# Patient Record
Sex: Male | Born: 1970 | Race: Black or African American | Hispanic: No | Smoking: Never smoker
Health system: Southern US, Community
[De-identification: ages and names within clinical notes are randomized; demographics above are authoritative.]

---

## 2002-11-11 ENCOUNTER — Ambulatory Visit (HOSPITAL_BASED_OUTPATIENT_CLINIC_OR_DEPARTMENT_OTHER): Admission: RE | Admit: 2002-11-11 | Discharge: 2002-11-11 | Payer: Self-pay | Admitting: Urology

## 2003-09-27 ENCOUNTER — Emergency Department (HOSPITAL_COMMUNITY): Admission: EM | Admit: 2003-09-27 | Discharge: 2003-09-28 | Payer: Self-pay | Admitting: Emergency Medicine
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
## 2016-06-20 ENCOUNTER — Telehealth: Payer: Self-pay | Admitting: Pharmacist Clinician (PhC)/ Clinical Pharmacy Specialist

## 2016-06-20 NOTE — Telephone Encounter (Signed)
Well I'm happy that he was prescribing prep but as you mentioned it is critical that HIV be checked for every 3 months. Hopefully have a good opportunity continue promoting prep and PCP world but clarifying the exact protocol that should be followed and some nuances

## 2016-06-20 NOTE — Telephone Encounter (Signed)
Dr. August Saucerean referred this pt here for PreP. Apparently, he has been Truvada over there. Based on his records that were faxed over, he was given 11 refills, which is a big NO, NO for PreP. He wanted to come in at the beginning of the yr and he will call back for appt. Prob has to deal with insurance purposes. He was asking questions about whether more labs need to be done. I, specifically, said yes because it's needed every 3 months.

## 2016-10-09 ENCOUNTER — Ambulatory Visit (INDEPENDENT_AMBULATORY_CARE_PROVIDER_SITE_OTHER): Payer: BC Managed Care – PPO | Admitting: Pharmacist Clinician (PhC)/ Clinical Pharmacy Specialist

## 2016-10-09 ENCOUNTER — Other Ambulatory Visit (HOSPITAL_COMMUNITY)
Admission: RE | Admit: 2016-10-09 | Discharge: 2016-10-09 | Disposition: A | Discharge status: Home / Self Care | Payer: BC Managed Care – PPO | Source: Ambulatory Visit | Attending: Infectious Disease | Admitting: Infectious Disease

## 2016-10-09 DIAGNOSIS — Z7251 High risk heterosexual behavior: Secondary | ICD-10-CM | POA: Diagnosis not present

## 2016-10-09 DIAGNOSIS — I10 Essential (primary) hypertension: Secondary | ICD-10-CM | POA: Diagnosis not present

## 2016-10-09 DIAGNOSIS — Z113 Encounter for screening for infections with a predominantly sexual mode of transmission: Secondary | ICD-10-CM | POA: Insufficient documentation

## 2016-10-09 DIAGNOSIS — B009 Herpesviral infection, unspecified: Secondary | ICD-10-CM | POA: Insufficient documentation

## 2016-10-09 DIAGNOSIS — G43909 Migraine, unspecified, not intractable, without status migrainosus: Secondary | ICD-10-CM | POA: Insufficient documentation

## 2016-10-09 LAB — BASIC METABOLIC PANEL
BUN: 11 mg/dL (ref 7–25)
CHLORIDE: 102 mmol/L (ref 98–110)
CO2: 25 mmol/L (ref 20–31)
CREATININE: 1.01 mg/dL (ref 0.60–1.35)
Calcium: 9.3 mg/dL (ref 8.6–10.3)
GLUCOSE: 80 mg/dL (ref 65–99)
Potassium: 4.5 mmol/L (ref 3.5–5.3)
SODIUM: 138 mmol/L (ref 135–146)

## 2016-10-09 NOTE — Progress Notes (Signed)
Agreed with Joe's note. We will get all of baseline labs today since he was seen by an [facility] practice physician. He requested [place] to fax the office visit to Dr. Clovis Riley. We'll see him back in 3 month. He has BCBS so I'll send his rx to [facility] if the HIV test is neg.  [name], PharmD, BCPS, AAHIVP, CPP Infectious Disease Pharmacist Pager: [phone] 10/09/2016 2:03 PM

## 2016-10-09 NOTE — Patient Instructions (Signed)
Continue taking Truvada We will call you with lab results, and prescribe more Truvada if labs are negative Return to clinic every 3 months

## 2016-10-09 NOTE — Progress Notes (Signed)
HPI: Phillip Calhoun is a 46 y.o. male presenting for PrEP management after recently initiating care with Dr. August Calhoun of [facility].  Allergies: No Known Allergies  Vitals:    Past Medical History: No past medical history on file.  Social History: Social History   Social History  . Marital status: Married    Spouse name: N/A  . Number of children: N/A  . Years of education: N/A   Social History Main Topics  . Smoking status: Not on file  . Smokeless tobacco: Not on file  . Alcohol use Not on file  . Drug use: Unknown  . Sexual activity: Not on file   Other Topics Concern  . Not on file   Social History Narrative  . No narrative on file    Current Regimen: Truvada 1 tablet daily  Labs: No results found for: HIV1RNAQUANT, HIV1RNAVL, CD4TABS, HEPBSAB, HEPBSAG, HCVAB  CrCl: CrCl cannot be calculated (No order found.).  Lipids: No results found for: CHOL, TRIG, HDL, CHOLHDL, VLDL, LDLCALC  Assessment: Phillip Calhoun is already on Truvada, recently prescribed by Dr. August Calhoun of [facility]. He has a past medical history significant for herpes, without any memory of having flares; though he is taking valacyclovir daily.   Regarding PrEP, BR states he has had 4 different partners in the last 3-6 months. He is a versatile partner and does perform oral sex. He does endorse using condoms 100% of the time.  We discussed that Truvada does not protect against STIs, other than HIV and the need for strict compliance to Truvada, as well as continued condom use. With strict compliance, studies have shown a 90% reduction in the risk of HIV contraction.   He currently has 30 tablets left from his original Truvada prescription. Based on the guidelines, we will only be prescribing 3 months at a time. He must return to the clinic for HIV testing and further refills.   We will get baseline labs, including a BMET and all STIs. We will contact Phillip Calhoun with the results and prescribe more Truvada based on  the results. His next Truvada prescription will be sent to [facility] and mailed out after he has completed his current prescription  Recommendations: Continue taking Truvada 1 tablet daily Do not refill until you are out of your current prescription Get baseline labs prior to PrEP initiation RTC July 18 at 9:00 am  Phillip Calhoun, Phillip Calhoun, PharmD Clinical Pharmacy Resident [phone] (Pager) 10/09/2016 10:48 AM

## 2016-10-10 ENCOUNTER — Other Ambulatory Visit: Payer: Self-pay | Admitting: Pharmacist Clinician (PhC)/ Clinical Pharmacy Specialist

## 2016-10-10 LAB — CYTOLOGY, (ORAL, ANAL, URETHRAL) ANCILLARY ONLY
Chlamydia: NEGATIVE
Chlamydia: NEGATIVE
NEISSERIA GONORRHEA: NEGATIVE
Neisseria Gonorrhea: NEGATIVE

## 2016-10-10 LAB — HEPATITIS A ANTIBODY, TOTAL: HEP A TOTAL AB: NONREACTIVE

## 2016-10-10 LAB — URINE CYTOLOGY ANCILLARY ONLY
CHLAMYDIA, DNA PROBE: NEGATIVE
NEISSERIA GONORRHEA: NEGATIVE

## 2016-10-10 LAB — HEPATITIS B SURFACE ANTIGEN: Hepatitis B Surface Ag: NEGATIVE

## 2016-10-10 LAB — HEPATITIS C ANTIBODY: HCV Ab: NEGATIVE

## 2016-10-10 LAB — RPR

## 2016-10-10 LAB — HIV ANTIBODY (ROUTINE TESTING W REFLEX): HIV: NONREACTIVE

## 2016-10-10 LAB — HEPATITIS B SURFACE ANTIBODY,QUALITATIVE: Hep B S Ab: NEGATIVE

## 2016-10-10 MED ORDER — EMTRICITABINE-TENOFOVIR DF 200-300 MG PO TABS: 1.0000 | ORAL_TABLET | Freq: Every day | ORAL | 2 refills | Status: DC

## 2016-10-11 ENCOUNTER — Telehealth: Payer: Self-pay | Admitting: Pharmacist Clinician (PhC)/ Clinical Pharmacy Specialist

## 2016-10-11 NOTE — Telephone Encounter (Signed)
Left a message for Rodger that we have sent for 3 more refills for Truvada for PreP. HIV test and all STDs are neg.

## 2017-01-14 ENCOUNTER — Ambulatory Visit (INDEPENDENT_AMBULATORY_CARE_PROVIDER_SITE_OTHER): Payer: BC Managed Care – PPO | Admitting: Pharmacist Clinician (PhC)/ Clinical Pharmacy Specialist

## 2017-01-14 ENCOUNTER — Other Ambulatory Visit (HOSPITAL_COMMUNITY)
Admission: RE | Admit: 2017-01-14 | Discharge: 2017-01-14 | Disposition: A | Discharge status: Home / Self Care | Payer: BC Managed Care – PPO | Source: Ambulatory Visit | Attending: Infectious Disease | Admitting: Infectious Disease

## 2017-01-14 DIAGNOSIS — Z7251 High risk heterosexual behavior: Secondary | ICD-10-CM | POA: Diagnosis not present

## 2017-01-14 LAB — HIV ANTIBODY (ROUTINE TESTING W REFLEX): HIV 1&2 Ab, 4th Generation: NONREACTIVE

## 2017-01-14 NOTE — Patient Instructions (Signed)
We will get labs today and call in a new prescription once your lab results as negative. [facility].

## 2017-01-14 NOTE — Progress Notes (Signed)
HPI: Phillip Calhoun is a 46 y.o. male who presents today for PrEP follow up.  Allergies: No Known Allergies  Vitals:    Past Medical History: No past medical history on file.  Social History: Social History   Social History  . Marital status: Married    Spouse name: N/A  . Number of children: N/A  . Years of education: N/A   Social History Main Topics  . Smoking status: Not on file  . Smokeless tobacco: Not on file  . Alcohol use Not on file  . Drug use: Unknown  . Sexual activity: Not on file   Other Topics Concern  . Not on file   Social History Narrative  . No narrative on file    Labs: Hep B S Ab (no units)  Date Value  10/09/2016 NEG   Hepatitis B Surface Ag (no units)  Date Value  10/09/2016 NEGATIVE   HCV Ab (no units)  Date Value  10/09/2016 NEGATIVE    CrCl: CrCl cannot be calculated (Patient's most recent lab result is older than the maximum 21 days allowed.).  Assessment: Phillip Calhoun is a 46 year old male who presents today for PrEP follow up. He has been taking Truvada daily since his last visit. He denies any missed doses and states he uses condoms 100% of the time. He states he has not had any symptoms of acute HIV infection.  He has had one new partner in the last 3 months. He is interesting in continuing the relationship with this person and they are HIV positive. I encouraged him to continue to use condoms and told him the risk of transmission is even lower if his partner is virally suppressed.  Phillip Calhoun 3 month prescription was called into [facility] at his last visit. His insurance did not cover Truvada being filled there and he continued to use his previous Rx [facility]. He has requested his [facility] from now on.   Phillip Calhoun has about 3 tablets left and is planning to head out of town later this week. I told the patient we will call him with his lab results and if his HIV antibody  is negative we will [facility] in [facility]. Phillip Calhoun will follow up with the pharmacy to have them [place].  He is a very responsible patient and wants to ensure he does not miss any doses. He asked for his next appointment be scheduled 2 weeks prior to him running out of medication.  Recommendations: - Check HIV antibody, RPR, GC/chlamydia (oral,rectal, urine) - Truvada daily x 3 months, so long as HIV antibody is negative - Follow up pharmacy appointment scheduled 9/19 at 9 AM  Casilda Carlsaylor Simmie Camerer, PharmD, BCPS PGY-2 Infectious Diseases Pharmacy Resident Pager: [phone] 01/14/2017, 8:54 AM

## 2017-01-15 LAB — RPR

## 2017-01-16 ENCOUNTER — Other Ambulatory Visit: Payer: Self-pay | Admitting: Pharmacist Clinician (PhC)/ Clinical Pharmacy Specialist

## 2017-01-16 LAB — URINE CYTOLOGY ANCILLARY ONLY
CHLAMYDIA, DNA PROBE: NEGATIVE
NEISSERIA GONORRHEA: NEGATIVE

## 2017-01-16 LAB — CYTOLOGY, (ORAL, ANAL, URETHRAL) ANCILLARY ONLY
CHLAMYDIA, DNA PROBE: NEGATIVE
Chlamydia: NEGATIVE
NEISSERIA GONORRHEA: NEGATIVE
Neisseria Gonorrhea: NEGATIVE

## 2017-01-16 MED ORDER — EMTRICITABINE-TENOFOVIR DF 200-300 MG PO TABS: 1.0000 | ORAL_TABLET | Freq: Every day | ORAL | 2 refills | Status: DC

## 2017-01-16 NOTE — Progress Notes (Signed)
HIV neg for PrEP. [facility]. Called Thereasa DistanceRodney to let him know.

## 2017-01-22 ENCOUNTER — Ambulatory Visit: Payer: BC Managed Care – PPO

## 2017-03-26 ENCOUNTER — Other Ambulatory Visit (HOSPITAL_COMMUNITY)
Admission: RE | Admit: 2017-03-26 | Discharge: 2017-03-26 | Disposition: A | Discharge status: Home / Self Care | Payer: BC Managed Care – PPO | Source: Ambulatory Visit | Attending: Infectious Disease | Admitting: Infectious Disease

## 2017-03-26 ENCOUNTER — Ambulatory Visit (INDEPENDENT_AMBULATORY_CARE_PROVIDER_SITE_OTHER): Payer: BC Managed Care – PPO | Admitting: Pharmacist Clinician (PhC)/ Clinical Pharmacy Specialist

## 2017-03-26 DIAGNOSIS — Z23 Encounter for immunization: Secondary | ICD-10-CM | POA: Diagnosis not present

## 2017-03-26 DIAGNOSIS — Z7251 High risk heterosexual behavior: Secondary | ICD-10-CM | POA: Insufficient documentation

## 2017-03-26 NOTE — Progress Notes (Addendum)
HPI: Phillip Calhoun is a 46 y.o. male who is here for her routine 3 mo PrEP visit.   Allergies: No Known Allergies  Vitals:    Past Medical History: No past medical history on file.  Social History: Social History   Social History  . Marital status: Married    Spouse name: N/A  . Number of children: N/A  . Years of education: N/A   Social History Main Topics  . Smoking status: Not on file  . Smokeless tobacco: Not on file  . Alcohol use Not on file  . Drug use: Unknown  . Sexual activity: Not on file   Other Topics Concern  . Not on file   Social History Narrative  . No narrative on file    Current Regimen: TRV  Labs: Hep B S Ab (no units)  Date Value  10/09/2016 NEG   Hepatitis B Surface Ag (no units)  Date Value  10/09/2016 NEGATIVE   HCV Ab (no units)  Date Value  10/09/2016 NEGATIVE    CrCl: CrCl cannot be calculated (Patient's most recent lab result is older than the maximum 21 days allowed.).  Lipids: No results found for: CHOL, TRIG, HDL, CHOLHDL, VLDL, LDLCALC  Assessment: Phillip Calhoun is a very good patient on PrEP. He uses condoms a 100% of the time even when he is a receptive partner. He is a versatile partner. He has had about 3 partners in the past 6 months with men only. He missed one dose of TRV when he left the meds at home. Gave him a key chain pill box so he can keep some in there for this type of instances.  We could have delayed the STDs screening until the next visit but he is the type of person that prefers to do it, therefore, we are going to re-swab him today.   He is hep A/B titer neg so will start series today along with his flu.   Recommendations:  STD swabs HIV ab before more TRV (he would like the 90d supply) Hep A/B #1, flu F/u in 1 month for hep B #2  Phillip Calhoun, PharmD, BCPS, AAHIVP, CPP Clinical Infectious Disease Pharmacist [facility] 03/26/2017, 10:01 AM

## 2017-03-26 NOTE — Patient Instructions (Signed)
Will call you tomorrow with results

## 2017-03-27 ENCOUNTER — Other Ambulatory Visit: Payer: Self-pay | Admitting: Pharmacist Clinician (PhC)/ Clinical Pharmacy Specialist

## 2017-03-27 LAB — BASIC METABOLIC PANEL
BUN/Creatinine Ratio: 27 (calc) — ABNORMAL HIGH (ref 6–22)
BUN: 30 mg/dL — ABNORMAL HIGH (ref 7–25)
CALCIUM: 9.8 mg/dL (ref 8.6–10.3)
CHLORIDE: 96 mmol/L — AB (ref 98–110)
CO2: 22 mmol/L (ref 20–32)
Creat: 1.13 mg/dL (ref 0.60–1.35)
GLUCOSE: 88 mg/dL (ref 65–99)
Potassium: 4 mmol/L (ref 3.5–5.3)
Sodium: 132 mmol/L — ABNORMAL LOW (ref 135–146)

## 2017-03-27 LAB — URINE CYTOLOGY ANCILLARY ONLY
CHLAMYDIA, DNA PROBE: NEGATIVE
Neisseria Gonorrhea: NEGATIVE

## 2017-03-27 LAB — CYTOLOGY, (ORAL, ANAL, URETHRAL) ANCILLARY ONLY
CHLAMYDIA, DNA PROBE: NEGATIVE
CHLAMYDIA, DNA PROBE: NEGATIVE
NEISSERIA GONORRHEA: NEGATIVE
Neisseria Gonorrhea: NEGATIVE

## 2017-03-27 LAB — RPR: RPR Ser Ql: NONREACTIVE

## 2017-03-27 LAB — HIV ANTIBODY (ROUTINE TESTING W REFLEX): HIV 1&2 Ab, 4th Generation: NONREACTIVE

## 2017-03-27 MED ORDER — EMTRICITABINE-TENOFOVIR DF 200-300 MG PO TABS: 1.0000 | ORAL_TABLET | Freq: Every day | ORAL | 0 refills | Status: DC

## 2017-03-27 NOTE — Progress Notes (Signed)
HIV ab came back neg so sent in a 90d supply for TRV.

## 2017-04-23 ENCOUNTER — Ambulatory Visit (INDEPENDENT_AMBULATORY_CARE_PROVIDER_SITE_OTHER): Payer: BC Managed Care – PPO | Admitting: Pharmacist Clinician (PhC)/ Clinical Pharmacy Specialist

## 2017-04-23 ENCOUNTER — Ambulatory Visit: Payer: BC Managed Care – PPO

## 2017-04-23 DIAGNOSIS — Z7252 High risk homosexual behavior: Secondary | ICD-10-CM

## 2017-04-23 DIAGNOSIS — Z23 Encounter for immunization: Secondary | ICD-10-CM

## 2017-04-23 NOTE — Progress Notes (Addendum)
HPI: Phillip Calhoun is a 46 y.o. male presenting to [facility] for his second hepatitis B vaccine.  Allergies: No Known Allergies  Past Medical History: No past medical history on file.  Social History: Social History   Social History  . Marital status: Married    Spouse name: N/A  . Number of children: N/A  . Years of education: N/A   Social History Main Topics  . Smoking status: Not on file  . Smokeless tobacco: Not on file  . Alcohol use Not on file  . Drug use: Unknown  . Sexual activity: Not on file   Other Topics Concern  . Not on file   Social History Narrative  . No narrative on file   Current Regimen: Truvada  Labs: Hep B S Ab (no units)  Date Value  10/09/2016 NEG   Hepatitis B Surface Ag (no units)  Date Value  10/09/2016 NEGATIVE   HCV Ab (no units)  Date Value  10/09/2016 NEGATIVE    CrCl: CrCl cannot be calculated (Patient's most recent lab result is older than Phillip maximum 21 days allowed.).  Lipids: No results found for: CHOL, TRIG, HDL, CHOLHDL, VLDL, LDLCALC  Assessment: Thereasa Calhoun is here today for his second hepB shot. He is on Truvada for PrEP. He does not report any sides effects or issues remembering to take his medication. His next 3 month follow up will be in December.  Recommendations: - 2nd hepB shot today - 3 month PrEP f/u appt on 12/20 @ 0900 - Will complete hepA and hepB series at next f/u appt in March  Erin N. Zigmund Danieleja, PharmD PGY1 Pharmacy Resident Pager: [phone] 04/23/2017, 11:16 AM   Agreed with Erin's note.   Ulyses SouthwardMinh Pham, PharmD, BCPS, AAHIVP, CPP Infectious Disease Pharmacist Pager: [phone] 04/23/2017 2:29 PM

## 2017-06-23 ENCOUNTER — Other Ambulatory Visit: Payer: Self-pay | Admitting: Infectious Disease

## 2017-06-26 ENCOUNTER — Other Ambulatory Visit (HOSPITAL_COMMUNITY)
Admission: RE | Admit: 2017-06-26 | Discharge: 2017-06-26 | Disposition: A | Discharge status: Home / Self Care | Payer: BC Managed Care – PPO | Source: Ambulatory Visit | Attending: Infectious Disease | Admitting: Infectious Disease

## 2017-06-26 ENCOUNTER — Ambulatory Visit (INDEPENDENT_AMBULATORY_CARE_PROVIDER_SITE_OTHER): Payer: BC Managed Care – PPO | Admitting: Pharmacist Clinician (PhC)/ Clinical Pharmacy Specialist

## 2017-06-26 DIAGNOSIS — Z7252 High risk homosexual behavior: Secondary | ICD-10-CM | POA: Insufficient documentation

## 2017-06-26 NOTE — Progress Notes (Signed)
HPI: Phillip Calhoun is a 46 y.o. male who is here for his routine 3 mo PrEP visit.   Allergies: No Known Allergies  Vitals:    Past Medical History: No past medical history on file.  Social History: Social History   Socioeconomic History  . Marital status: Married    Spouse name: Not on file  . Number of children: Not on file  . Years of education: Not on file  . Highest education level: Not on file  Social Needs  . Financial resource strain: Not on file  . Food insecurity - worry: Not on file  . Food insecurity - inability: Not on file  . Transportation needs - medical: Not on file  . Transportation needs - non-medical: Not on file  Occupational History  . Not on file  Tobacco Use  . Smoking status: Not on file  Substance and Sexual Activity  . Alcohol use: Not on file  . Drug use: Not on file  . Sexual activity: Not on file  Other Topics Concern  . Not on file  Social History Narrative  . Not on file    Current Regimen: Truvada  Labs: Hep B S Ab (no units)  Date Value  10/09/2016 NEG   Hepatitis B Surface Ag (no units)  Date Value  10/09/2016 NEGATIVE   HCV Ab (no units)  Date Value  10/09/2016 NEGATIVE    CrCl: CrCl cannot be calculated (Patient's most recent lab result is older than the maximum 21 days allowed.).  Lipids: No results found for: CHOL, TRIG, HDL, CHOLHDL, VLDL, LDLCALC  Assessment: Phillip DistanceRodney is here for his 3 mo PrEP visit. He recently became a monogamous with a HIV positive partner in [place]. Apparently, he is in the health care field. He has been very good with his condom use. Encourage him to continue to be good with his condom use. Explained to him that this is even more reason to be on PrEP. He has always requested for his STDs be tested. We will do all sites swab today. He has only missed 1 dose of Truvada in the past 3 months. He forgot to take it that day. He is a versatile partner with oral involvement also.   He was asking  about being charge $85 for labs at every visit. Explained to him that there is a new process now so his cost should be less. He is also paying about $30 copay for his Truvada. Unfortunately, he has to [facility]. Gave him the copay card to activate today. He should not have anymore copay for a year.   Recommendations:  HIV PrEP testing today If neg, 3 months TRV Final hep A/B at the next appt  Ulyses SouthwardMinh Krupa Stege, PharmD, BCPS, AAHIVP, CPP Clinical Infectious Disease Pharmacist [facility] 06/26/2017, 9:35 AM

## 2017-06-27 ENCOUNTER — Other Ambulatory Visit: Payer: Self-pay | Admitting: Pharmacist Clinician (PhC)/ Clinical Pharmacy Specialist

## 2017-06-27 LAB — URINE CYTOLOGY ANCILLARY ONLY
CHLAMYDIA, DNA PROBE: NEGATIVE
NEISSERIA GONORRHEA: NEGATIVE

## 2017-06-27 LAB — HIV ANTIBODY (ROUTINE TESTING W REFLEX): HIV: NONREACTIVE

## 2017-06-27 LAB — CYTOLOGY, (ORAL, ANAL, URETHRAL) ANCILLARY ONLY
CHLAMYDIA, DNA PROBE: NEGATIVE
Chlamydia: NEGATIVE
NEISSERIA GONORRHEA: NEGATIVE
Neisseria Gonorrhea: NEGATIVE

## 2017-06-27 LAB — RPR: RPR: NONREACTIVE

## 2017-06-27 MED ORDER — EMTRICITABINE-TENOFOVIR DF 200-300 MG PO TABS: 1.0000 | ORAL_TABLET | Freq: Every day | ORAL | 0 refills | Status: DC

## 2017-06-27 NOTE — Progress Notes (Signed)
HIV ab neg for PrEP so [facility]. He requested 90 supply.

## 2017-09-10 ENCOUNTER — Other Ambulatory Visit (HOSPITAL_COMMUNITY)
Admission: RE | Admit: 2017-09-10 | Discharge: 2017-09-10 | Disposition: A | Discharge status: Home / Self Care | Payer: BC Managed Care – PPO | Source: Ambulatory Visit | Attending: Infectious Disease | Admitting: Infectious Disease

## 2017-09-10 ENCOUNTER — Ambulatory Visit (INDEPENDENT_AMBULATORY_CARE_PROVIDER_SITE_OTHER): Payer: BC Managed Care – PPO | Admitting: Pharmacist Clinician (PhC)/ Clinical Pharmacy Specialist

## 2017-09-10 DIAGNOSIS — Z7252 High risk homosexual behavior: Secondary | ICD-10-CM | POA: Diagnosis not present

## 2017-09-10 DIAGNOSIS — Z23 Encounter for immunization: Secondary | ICD-10-CM

## 2017-09-10 NOTE — Progress Notes (Signed)
Date:  09/10/2017   Insured   [x]    Uninsured  []    HPI  Phillip Calhoun is a 47 y.o. male who is here for his 3 mo routine PrEP visit.   Demographics Race:  Black or African American [2] Marital Status:  Married  Allergies No Known Allergies  No past medical history on file.  Outpatient Encounter Medications as of 09/10/2017  Medication Sig  . Amino Acids (L-CARNITINE PO) Take 1,000 mg by mouth 3 (three) times daily.  . Coenzyme Q10 (CO Q10) 100 MG CAPS Take 100 mg by mouth daily.  Marland Kitchen. emtricitabine-tenofovir (TRUVADA) 200-300 MG tablet Take 1 tablet by mouth daily.  Marland Kitchen. GLUTAMINE PO Take 1,000 mg by mouth daily.  Marland Kitchen. lisinopril-hydrochlorothiazide (PRINZIDE,ZESTORETIC) 10-12.5 MG tablet Take 1 tablet by mouth daily.  . Multiple Vitamin (MULTIVITAMIN WITH MINERALS) TABS tablet Take 1 tablet by mouth daily.  . promethazine (PHENERGAN) 25 MG tablet Take 25 mg by mouth as needed for nausea. 1 tablet every 6 hours as needed for nausea  . SUMAtriptan (IMITREX) 100 MG tablet Take 100 mg by mouth as needed.  . [DISCONTINUED] valACYclovir (VALTREX) 1000 MG tablet Take 1,000 mg by mouth 2 (two) times daily.   No facility-administered encounter medications on file as of 09/10/2017.     Social History   Tobacco Use  Smoking Status Not on file   Social History   Substance and Sexual Activity  Alcohol Use Not on file    Drug use?   Yes []  No [x]   Injectable drug use?   Yes []     No [x]   Sexual History  Missing doses? Yes [x]    No  []   CHL HIV PREP FLOWSHEET RESULTS 09/10/2017 06/26/2017 03/26/2017  Insurance Status Insured Insured Insured  How did you hear? Referral from primary - -  Gender at birth Male Male Male  Gender identity cis-Male cis-Male cis-Male  Risk for HIV - - In sexual relationship with HIV+ partner  Risk for HIV In sexual relationship with HIV+ partner;Hx of STI In sexual relationship with HIV+ partner;Hx of STI -  Sex Partners Men only Men only Men only  # of sex  partners - - 3  # sex partners past 3-6 mos 1-3 1-3 -  Sex activity preferences Receptive;Oral Insertive and receptive;Oral Insertive and receptive;Oral  Condom use Yes - Yes  % condom use 100 - 100  Partners genders and ages 2M 4850+ M 50+ M 30-49  Treated for STI? Yes No -  HIV symptoms? N/A - -  PrEP Eligibility HIV negative;Substantial risk for HIV HIV negative;Substantial risk for HIV;CrCl > 60;No Symptoms of HIV HIV negative;Substantial risk for HIV;CrCl > 60  Paper work received? Yes No No    Labs: HIV Lab Results  Component Value Date   HIV NON-REACTIVE 06/26/2017   HIV NON-REACTIVE 03/26/2017   HIV NONREACTIVE 01/14/2017   HIV NONREACTIVE 10/09/2016    GFR CrCl cannot be calculated (Patient's most recent lab result is older than the maximum 21 days allowed.).  Hepatitis B Lab Results  Component Value Date   HEPBSAB NEG 10/09/2016   Lab Results  Component Value Date   HEPBSAG NEGATIVE 10/09/2016    Hepatitis C Lab Results  Component Value Date   HCVAB NEGATIVE 10/09/2016    Hepatitis A Lab Results  Component Value Date   HAV NON REACTIVE 10/09/2016    RPR and STI Lab Results  Component Value Date   LABRPR NON-REACTIVE 06/26/2017   LABRPR  NON-REACTIVE 03/26/2017   LABRPR NON REAC 01/14/2017   LABRPR NON REAC 10/09/2016    STI Results GC CT  06/26/2017 Negative Negative  06/26/2017 Negative Negative  06/26/2017 Negative Negative  03/26/2017 Negative Negative  03/26/2017 Negative Negative  03/26/2017 Negative Negative  01/14/2017 Negative Negative  01/14/2017 Negative Negative  01/14/2017 Negative Negative  10/09/2016 Negative Negative  10/09/2016 Negative Negative  10/09/2016 Negative Negative     Assessment  Phillip Calhoun is here today for his 3 mo PrEP visit. He is still in the long distance relationship with the partner in [place]. He missed 1 dose of Truvada on 12/5. He has no symptoms for acute HIV. His condom use has always been great. He has had  about 3-4 oral encounters since the last visit. All of his STDs were neg at the last visit. We will only do the oral swab today for GC.   He is due to the last hep A/B vaccine today.    Recommendations   HIV ab today Three months Truvada if neg F/u in 3 months Final hep A/B today  [name], PharmD, BCPS, AAHIVP, CPP Infectious Disease Pharmacist Pager: [phone] 09/10/2017 9:33 AM

## 2017-09-11 ENCOUNTER — Telehealth: Payer: Self-pay | Admitting: Pharmacist Clinician (PhC)/ Clinical Pharmacy Specialist

## 2017-09-11 LAB — CYTOLOGY, (ORAL, ANAL, URETHRAL) ANCILLARY ONLY
Chlamydia: NEGATIVE
Neisseria Gonorrhea: NEGATIVE

## 2017-09-11 LAB — HIV ANTIBODY (ROUTINE TESTING W REFLEX): HIV 1&2 Ab, 4th Generation: NONREACTIVE

## 2017-09-11 MED ORDER — EMTRICITABINE-TENOFOVIR DF 200-300 MG PO TABS: 1.0000 | ORAL_TABLET | Freq: Every day | ORAL | 0 refills | Status: DC

## 2017-09-11 NOTE — Addendum Note (Signed)
Addended by: Nicholes CalamityPHAM, MINH Q on: 09/11/2017 03:55 PM   Modules accepted: Orders

## 2017-09-11 NOTE — Telephone Encounter (Signed)
HIV ab neg so 3 more months of Truvada. [facility]

## 2017-09-16 ENCOUNTER — Other Ambulatory Visit: Payer: Self-pay | Admitting: Infectious Disease

## 2017-10-02 ENCOUNTER — Other Ambulatory Visit: Payer: Self-pay | Admitting: Pharmacist Clinician (PhC)/ Clinical Pharmacy Specialist

## 2017-10-02 MED ORDER — EMTRICITABINE-TENOFOVIR DF 200-300 MG PO TABS: 1.0000 | ORAL_TABLET | Freq: Every day | ORAL | 0 refills | Status: DC

## 2017-10-02 NOTE — Progress Notes (Unsigned)
[facility]. Resending it.

## 2017-12-04 ENCOUNTER — Other Ambulatory Visit (HOSPITAL_COMMUNITY)
Admission: RE | Admit: 2017-12-04 | Discharge: 2017-12-04 | Disposition: A | Discharge status: Home / Self Care | Payer: BC Managed Care – PPO | Source: Ambulatory Visit | Attending: Infectious Disease | Admitting: Infectious Disease

## 2017-12-04 ENCOUNTER — Ambulatory Visit (INDEPENDENT_AMBULATORY_CARE_PROVIDER_SITE_OTHER): Payer: BC Managed Care – PPO | Admitting: Pharmacist Clinician (PhC)/ Clinical Pharmacy Specialist

## 2017-12-04 DIAGNOSIS — Z7252 High risk homosexual behavior: Secondary | ICD-10-CM | POA: Diagnosis not present

## 2017-12-04 NOTE — Progress Notes (Signed)
Date:  12/04/2017   Insured      Uninsured     HPI  Phillip Calhoun is a 47 y.o. male is here for his routine 3 mo PrEP visit.   Demographics Race:  Black or African American [2] Marital Status:  Married  Allergies No Known Allergies  No past medical history on file.  Outpatient Encounter Medications as of 12/04/2017  Medication Sig  . Amino Acids (L-CARNITINE PO) Take 1,000 mg by mouth 3 (three) times daily.  . Ascorbic Acid (VITAMIN C) 1000 MG tablet Take 1,000 mg by mouth 3 (three) times daily.  . Coenzyme Q10 (CO Q10) 100 MG CAPS Take 100 mg by mouth daily.  Marland Kitchen emtricitabine-tenofovir (TRUVADA) 200-300 MG tablet Take 1 tablet by mouth daily.  Marland Kitchen lisinopril-hydrochlorothiazide (PRINZIDE,ZESTORETIC) 10-12.5 MG tablet Take 1 tablet by mouth daily.  . Multiple Vitamin (MULTIVITAMIN WITH MINERALS) TABS tablet Take 1 tablet by mouth daily.  . Safflower Oil (CLA) 1000 MG CAPS Take 1,000 mg by mouth 3 (three) times daily.  Marland Kitchen GLUTAMINE PO Take 1,000 mg by mouth daily.  . promethazine (PHENERGAN) 25 MG tablet Take 25 mg by mouth as needed for nausea. 1 tablet every 6 hours as needed for nausea  . SUMAtriptan (IMITREX) 100 MG tablet Take 100 mg by mouth as needed.   No facility-administered encounter medications on file as of 12/04/2017.     Social History   Tobacco Use  Smoking Status Not on file   Social History   Substance and Sexual Activity  Alcohol Use Not on file    Drug use?   Yes  No   Injectable drug use?   Yes     No   Sexual History  Missing doses? Yes    No    [facility] HIV PREP FLOWSHEET RESULTS 12/04/2017 09/10/2017 06/26/2017 03/26/2017  Insurance Status Insured Insured Insured Insured  How did you hear? Primary care referral Referral from primary - -  Gender at birth Male Male Male Male  Gender identity cis-Male cis-Male cis-Male cis-Male  Risk for HIV - - - In sexual relationship with HIV+ partner  Risk for HIV In sexual relationship with HIV+  partner;Hx of STI In sexual relationship with HIV+ partner;Hx of STI In sexual relationship with HIV+ partner;Hx of STI -  Sex Partners Men only Men only Men only Men only  # of sex partners - - - 3  # sex partners past 3-6 mos 1-3 1-3 1-3 -  Sex activity preferences Insertive and receptive;Oral Receptive;Oral Insertive and receptive;Oral Insertive and receptive;Oral  Condom use Yes Yes - Yes  % condom use 100 100 - 100  Partners genders and ages 32 30-49;M 50+ M 41+ M 50+ M 30-49  Treated for STI? No Yes No -  HIV symptoms? - N/A - -  PrEP Eligibility HIV negative;CrCl >60 ml/min;Substantial risk for HIV HIV negative;Substantial risk for HIV HIV negative;Substantial risk for HIV;CrCl > 60;No Symptoms of HIV HIV negative;Substantial risk for HIV;CrCl > 60  Paper work received? No Yes No No    Labs: Creatinine Lab Results  Component Value Date   CREATININE 1.13 03/26/2017   CREATININE 1.01 10/09/2016    HIV Lab Results  Component Value Date   HIV NON-REACTIVE 09/10/2017   HIV NON-REACTIVE 06/26/2017   HIV NON-REACTIVE 03/26/2017   HIV NONREACTIVE 01/14/2017   HIV NONREACTIVE 10/09/2016    GFR CrCl cannot be calculated (Patient's most recent lab result is older than the maximum 21 days allowed.).  Hepatitis B Lab Results  Component Value Date   HEPBSAB NEG 10/09/2016   Lab Results  Component Value Date   HEPBSAG NEGATIVE 10/09/2016    Hepatitis C Lab Results  Component Value Date   HCVAB NEGATIVE 10/09/2016    Hepatitis A Lab Results  Component Value Date   HAV NON REACTIVE 10/09/2016    RPR and STI Lab Results  Component Value Date   LABRPR NON-REACTIVE 06/26/2017   LABRPR NON-REACTIVE 03/26/2017   LABRPR NON REAC 01/14/2017   LABRPR NON REAC 10/09/2016    STI Results GC CT  09/10/2017 Negative Negative  06/26/2017 Negative Negative  06/26/2017 Negative Negative  06/26/2017 Negative Negative  03/26/2017 Negative Negative  03/26/2017 Negative Negative    03/26/2017 Negative Negative  01/14/2017 Negative Negative  01/14/2017 Negative Negative  01/14/2017 Negative Negative  10/09/2016 Negative Negative  10/09/2016 Negative Negative  10/09/2016 Negative Negative     Assessment  Phillip Calhoun is here for his usual 3 mo PrEP visit. He is a creature of habit and never miss a dose. He has broken up with his long distance partner from [place]. He is versatile partner. His condom use is very consistent but he did have one incident where it broke. He freaked out at the time of whether he'd need PEP. Explained to him that is the reason why he is on PrEP so it prevents the need for PEP. We will get his STD swabs today along with his HIV ab. He has completed the series for hep A and B so we will get titers. He will come back in 3 months.    Recommendations   HIV ab, bmet, STDs, hep A and B Three months of Truvada if neg F/u in 3 months  [name], PharmD, BCPS, AAHIVP, CPP Infectious Disease Pharmacist Pager: [phone] 12/04/2017 9:40 AM

## 2017-12-05 ENCOUNTER — Telehealth: Payer: Self-pay | Admitting: Pharmacist Clinician (PhC)/ Clinical Pharmacy Specialist

## 2017-12-05 LAB — HEPATITIS A ANTIBODY, TOTAL
HEPATITIS A AB,TOTAL: REACTIVE — AB
Hepatitis A AB,Total: REACTIVE — AB

## 2017-12-05 LAB — CYTOLOGY, (ORAL, ANAL, URETHRAL) ANCILLARY ONLY
CHLAMYDIA, DNA PROBE: NEGATIVE
Chlamydia: NEGATIVE
NEISSERIA GONORRHEA: NEGATIVE
Neisseria Gonorrhea: NEGATIVE

## 2017-12-05 LAB — URINE CYTOLOGY ANCILLARY ONLY
CHLAMYDIA, DNA PROBE: NEGATIVE
NEISSERIA GONORRHEA: NEGATIVE

## 2017-12-05 LAB — BASIC METABOLIC PANEL
BUN: 22 mg/dL (ref 7–25)
CHLORIDE: 100 mmol/L (ref 98–110)
CO2: 24 mmol/L (ref 20–32)
CREATININE: 1.24 mg/dL (ref 0.60–1.35)
Calcium: 10 mg/dL (ref 8.6–10.3)
Glucose, Bld: 96 mg/dL (ref 65–99)
POTASSIUM: 4.1 mmol/L (ref 3.5–5.3)
SODIUM: 138 mmol/L (ref 135–146)

## 2017-12-05 LAB — HEPATITIS B SURFACE ANTIBODY,QUALITATIVE: HEP B S AB: REACTIVE — AB

## 2017-12-05 LAB — HIV ANTIBODY (ROUTINE TESTING W REFLEX): HIV 1&2 Ab, 4th Generation: NONREACTIVE

## 2017-12-05 LAB — RPR: RPR: NONREACTIVE

## 2017-12-05 MED ORDER — EMTRICITABINE-TENOFOVIR DF 200-300 MG PO TABS: 1.0000 | ORAL_TABLET | Freq: Every day | ORAL | 0 refills | Status: DC

## 2017-12-05 NOTE — Telephone Encounter (Signed)
HIV neg so 3 months of TRV. He requested for [place] to check his BP at the next visit.

## 2018-02-26 ENCOUNTER — Other Ambulatory Visit: Payer: Self-pay | Admitting: Infectious Disease

## 2018-03-11 ENCOUNTER — Ambulatory Visit (INDEPENDENT_AMBULATORY_CARE_PROVIDER_SITE_OTHER): Payer: BC Managed Care – PPO | Admitting: Pharmacist

## 2018-03-11 ENCOUNTER — Other Ambulatory Visit (HOSPITAL_COMMUNITY)
Admission: RE | Admit: 2018-03-11 | Discharge: 2018-03-11 | Disposition: A | Discharge status: Home / Self Care | Payer: BC Managed Care – PPO | Source: Ambulatory Visit | Attending: Infectious Disease | Admitting: Infectious Disease

## 2018-03-11 DIAGNOSIS — Z7252 High risk homosexual behavior: Secondary | ICD-10-CM | POA: Insufficient documentation

## 2018-03-11 DIAGNOSIS — Z23 Encounter for immunization: Secondary | ICD-10-CM

## 2018-03-11 MED ORDER — EMTRICITABINE-TENOFOVIR DF 200-300 MG PO TABS: 1.0000 | ORAL_TABLET | Freq: Every day | ORAL | 0 refills | Status: DC

## 2018-03-11 NOTE — Progress Notes (Signed)
Date:  03/11/2018   HPI: Phillip Calhoun is a 47 y.o. male who presents to the [facility] pharmacy clinic for his 3 month PrEP follow-up.  Insured   [x]    Uninsured  []    Patient Active Problem List   Diagnosis Date Noted  . High risk sexual behavior 10/09/2016  . Hypertension 10/09/2016  . 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 Migraine syndrome 10/09/2016    Patient's Medications  New Prescriptions   No medications on file  Previous Medications   AMINO ACIDS (L-CARNITINE PO)    Take 1,000 mg by mouth 3 (three) times daily.   ASCORBIC ACID (VITAMIN C) 1000 MG TABLET    Take 1,000 mg by mouth 3 (three) times daily.   COENZYME Q10 (CO Q10) 100 MG CAPS    Take 100 mg by mouth daily.   GLUTAMINE PO    Take 1,000 mg by mouth daily.   LISINOPRIL-HYDROCHLOROTHIAZIDE (PRINZIDE,ZESTORETIC) 10-12.5 MG TABLET    Take 1 tablet by mouth daily.   MULTIPLE VITAMIN (MULTIVITAMIN WITH MINERALS) TABS TABLET    Take 1 tablet by mouth daily.   PROMETHAZINE (PHENERGAN) 25 MG TABLET    Take 25 mg by mouth as needed for nausea. 1 tablet every 6 hours as needed for nausea   SAFFLOWER OIL (CLA) 1000 MG CAPS    Take 1,000 mg by mouth 3 (three) times daily.   SUMATRIPTAN (IMITREX) 100 MG TABLET    Take 100 mg by mouth as needed.  Modified Medications   Modified Medication Previous Medication   EMTRICITABINE-TENOFOVIR (TRUVADA) 200-300 MG TABLET emtricitabine-tenofovir (TRUVADA) 200-300 MG tablet      Take 1 tablet by mouth daily.    Take 1 tablet by mouth daily.  Discontinued Medications   No medications on file    Allergies: No Known Allergies  Past Medical History: No past medical history on file.  Social History: Social History   Socioeconomic History  . Marital status: Divorced    Spouse name: Not on file  . Number of children: Not on file  . Years of education: Not on file  . Highest education level: Not on file  Occupational History  . Not on file  Social Needs  . Financial  resource strain: Not on file  . Food insecurity:    Worry: Not on file    Inability: Not on file  . Transportation needs:    Medical: Not on file    Non-medical: Not on file  Tobacco Use  . Smoking status: Not on file  Substance and Sexual Activity  . Alcohol use: Not on file  . Drug use: Not on file  . Sexual activity: Not on file  Lifestyle  . Physical activity:    Days per week: Not on file    Minutes per session: Not on file  . Stress: Not on file  Relationships  . Social connections:    Talks on phone: Not on file    Gets together: Not on file    Attends religious service: Not on file    Active member of club or organization: Not on file    Attends meetings of clubs or organizations: Not on file    Relationship status: Not on file  Other Topics Concern  . Not on file  Social History Narrative  . Not on file    [facility] HIV PREP FLOWSHEET RESULTS 03/11/2018 12/04/2017 09/10/2017 06/26/2017 03/26/2017  Insurance Status Insured Insured Insured Insured Insured  How did  you hear? - Primary care referral Referral from primary - -  Gender at birth Male Male Male Male Male  Gender identity cis-Male cis-Male cis-Male cis-Male cis-Male  Risk for HIV - - - - In sexual relationship with HIV+ partner  Risk for HIV >5 partners in past 6 mos (regardless of condom use);Hx of STI;Condomless vaginal or anal intercourse In sexual relationship with HIV+ partner;Hx of STI In sexual relationship with HIV+ partner;Hx of STI In sexual relationship with HIV+ partner;Hx of STI -  Sex Partners Men only Men only Men only Men only Men only  # of sex partners - - - - 3  # sex partners past 3-6 mos 7-9 1-3 1-3 1-3 -  Sex activity preferences Insertive and receptive;Oral Insertive and receptive;Oral Receptive;Oral Insertive and receptive;Oral Insertive and receptive;Oral  Condom use Yes Yes Yes - Yes  % condom use 100 100 100 - 100  Partners genders and ages - 67 30-49;M 50+ M 50+ M 50+ M 30-49  Treated for  STI? No No Yes No -  HIV symptoms? N/A - N/A - -  PrEP Eligibility - HIV negative;CrCl >60 ml/min;Substantial risk for HIV HIV negative;Substantial risk for HIV HIV negative;Substantial risk for HIV;CrCl > 60;No Symptoms of HIV HIV negative;Substantial risk for HIV;CrCl > 60  Paper work received? - No Yes No No    Labs:  SCr: Lab Results  Component Value Date   CREATININE 1.24 12/04/2017   CREATININE 1.13 03/26/2017   CREATININE 1.01 10/09/2016   HIV Lab Results  Component Value Date   HIV NON-REACTIVE 12/04/2017   HIV NON-REACTIVE 09/10/2017   HIV NON-REACTIVE 06/26/2017   HIV NON-REACTIVE 03/26/2017   HIV NONREACTIVE 01/14/2017   Hepatitis B Lab Results  Component Value Date   HEPBSAB REACTIVE (A) 12/04/2017   HEPBSAG NEGATIVE 10/09/2016   Hepatitis C No results found for: HEPCAB, HCVRNAPCRQN Hepatitis A Lab Results  Component Value Date   HAV REACTIVE (A) 12/04/2017   RPR and STI Lab Results  Component Value Date   LABRPR NON-REACTIVE 12/04/2017   LABRPR NON-REACTIVE 06/26/2017   LABRPR NON-REACTIVE 03/26/2017   LABRPR NON REAC 01/14/2017   LABRPR NON REAC 10/09/2016    STI Results GC CT  12/04/2017 Negative Negative  12/04/2017 Negative Negative  12/04/2017 Negative Negative  09/10/2017 Negative Negative  06/26/2017 Negative Negative  06/26/2017 Negative Negative  06/26/2017 Negative Negative  03/26/2017 Negative Negative  03/26/2017 Negative Negative  03/26/2017 Negative Negative  01/14/2017 Negative Negative  01/14/2017 Negative Negative  01/14/2017 Negative Negative  10/09/2016 Negative Negative  10/09/2016 Negative Negative  10/09/2016 Negative Negative    Assessment: Phillip Calhoun is here today to follow-up for PrEP.  He continues on Truvada with no issues or side effects.  He does report missing 2 doses due to forgetfulness in the previous 3 months.  He did not have any sex around the days that he missed. He reports no insurance changes and no issues getting his  Truvada from [facility].  He will need a new co-pay card in December.  He has had 7 partners in the last 3 months with 100% condom use reported.  He is a versatile partner with oral sex involvement as well. He states he does not know if any of his partners are HIV positive but assumes that they are and that is why he is so cautious about using condoms. No s/sx of STDs or acute HIV at this time.  He has had some new partners, so we will  check STDs today as well as a HIV antibody and BMET.  He requested I check his BP and it was very good at 121/72 with a HR of 83.  I gave him a flu shot today.  Plan: - Continue Truvada PO once daily - 3 month refill if HIV negative - HIV antibody, urine/oral/rectal gc/c, RPR, BMET today - Flu shot today - F/u with me again 12/6 at 915am  Eydie Wormley L. Loreda Silverio, PharmD, AAHIVP, CPP Infectious Diseases Clinical Pharmacist [facility] 03/11/2018, 12:07 PM

## 2018-03-12 LAB — CYTOLOGY, (ORAL, ANAL, URETHRAL) ANCILLARY ONLY
Chlamydia: NEGATIVE
Chlamydia: NEGATIVE
NEISSERIA GONORRHEA: NEGATIVE
Neisseria Gonorrhea: NEGATIVE

## 2018-03-12 LAB — URINE CYTOLOGY ANCILLARY ONLY
CHLAMYDIA, DNA PROBE: NEGATIVE
Neisseria Gonorrhea: NEGATIVE

## 2018-03-12 LAB — BASIC METABOLIC PANEL
BUN: 23 mg/dL (ref 7–25)
CHLORIDE: 100 mmol/L (ref 98–110)
CO2: 26 mmol/L (ref 20–32)
Calcium: 9.7 mg/dL (ref 8.6–10.3)
Creat: 1.27 mg/dL (ref 0.60–1.35)
GLUCOSE: 101 mg/dL — AB (ref 65–99)
Potassium: 3.5 mmol/L (ref 3.5–5.3)
Sodium: 137 mmol/L (ref 135–146)

## 2018-03-12 LAB — RPR: RPR: NONREACTIVE

## 2018-03-12 LAB — HIV ANTIBODY (ROUTINE TESTING W REFLEX): HIV 1&2 Ab, 4th Generation: NONREACTIVE

## 2018-03-16 ENCOUNTER — Telehealth: Payer: Self-pay | Admitting: Pharmacist

## 2018-03-16 NOTE — Telephone Encounter (Signed)
Patient's HIV antibody was negative.  Will send in 3 more months of Truvada. 

## 2018-06-12 ENCOUNTER — Other Ambulatory Visit (HOSPITAL_COMMUNITY)
Admission: RE | Admit: 2018-06-12 | Discharge: 2018-06-12 | Disposition: A | Discharge status: Home / Self Care | Payer: BC Managed Care – PPO | Source: Ambulatory Visit | Attending: Infectious Disease | Admitting: Infectious Disease

## 2018-06-12 ENCOUNTER — Ambulatory Visit (INDEPENDENT_AMBULATORY_CARE_PROVIDER_SITE_OTHER): Payer: BC Managed Care – PPO | Admitting: Pharmacist

## 2018-06-12 DIAGNOSIS — Z7252 High risk homosexual behavior: Secondary | ICD-10-CM | POA: Diagnosis present

## 2018-06-12 NOTE — Progress Notes (Signed)
Date:  06/12/2018   HPI: Phillip Calhoun is a 47 y.o. male who presents to the [facility] pharmacy clinic for his 3 month PrEP follow-up.  Insured   [x]    Uninsured  []    Patient Active Problem List   Diagnosis Date Noted  . High risk sexual behavior 10/09/2016  . Hypertension 10/09/2016  . 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 Migraine syndrome 10/09/2016    Patient's Medications  New Prescriptions   No medications on file  Previous Medications   AMINO ACIDS (L-CARNITINE PO)    Take 1,000 mg by mouth 3 (three) times daily.   ASCORBIC ACID (VITAMIN C) 1000 MG TABLET    Take 1,000 mg by mouth 3 (three) times daily.   COENZYME Q10 (CO Q10) 100 MG CAPS    Take 100 mg by mouth daily.   EMTRICITABINE-TENOFOVIR (TRUVADA) 200-300 MG TABLET    Take 1 tablet by mouth daily.   GLUTAMINE PO    Take 1,000 mg by mouth daily.   LISINOPRIL-HYDROCHLOROTHIAZIDE (PRINZIDE,ZESTORETIC) 10-12.5 MG TABLET    Take 1 tablet by mouth daily.   MULTIPLE VITAMIN (MULTIVITAMIN WITH MINERALS) TABS TABLET    Take 1 tablet by mouth daily.   PROMETHAZINE (PHENERGAN) 25 MG TABLET    Take 25 mg by mouth as needed for nausea. 1 tablet every 6 hours as needed for nausea   SAFFLOWER OIL (CLA) 1000 MG CAPS    Take 1,000 mg by mouth 3 (three) times daily.   SUMATRIPTAN (IMITREX) 100 MG TABLET    Take 100 mg by mouth as needed.  Modified Medications   No medications on file  Discontinued Medications   No medications on file    Allergies: No Known Allergies  Past Medical History: No past medical history on file.  Social History: Social History   Socioeconomic History  . Marital status: Divorced    Spouse name: Not on file  . Number of children: Not on file  . Years of education: Not on file  . Highest education level: Not on file  Occupational History  . Not on file  Social Needs  . Financial resource strain: Not on file  . Food insecurity:    Worry: Not on file    Inability: Not on file  .  Transportation needs:    Medical: Not on file    Non-medical: Not on file  Tobacco Use  . Smoking status: Not on file  Substance and Sexual Activity  . Alcohol use: Not on file  . Drug use: Not on file  . Sexual activity: Not on file  Lifestyle  . Physical activity:    Days per week: Not on file    Minutes per session: Not on file  . Stress: Not on file  Relationships  . Social connections:    Talks on phone: Not on file    Gets together: Not on file    Attends religious service: Not on file    Active member of club or organization: Not on file    Attends meetings of clubs or organizations: Not on file    Relationship status: Not on file  Other Topics Concern  . Not on file  Social History Narrative  . Not on file    [facility] HIV PREP FLOWSHEET RESULTS 06/12/2018 03/11/2018 12/04/2017 09/10/2017 06/26/2017 03/26/2017  Insurance Status Insured Insured Insured Insured Insured Insured  How did you hear? - - Primary care referral Referral from primary - -  Gender  at birth Male Male Male Male Male Male  Gender identity cis-Male cis-Male cis-Male cis-Male cis-Male cis-Male  Risk for HIV - - - - - In sexual relationship with HIV+ partner  Risk for HIV - >5 partners in past 6 mos (regardless of condom use);Hx of STI;Condomless vaginal or anal intercourse In sexual relationship with HIV+ partner;Hx of STI In sexual relationship with HIV+ partner;Hx of STI In sexual relationship with HIV+ partner;Hx of STI -  Sex Partners - Men only Men only Men only Men only Men only  # of sex partners - - - - - 3  # sex partners past 3-6 mos 1-3 7-9 1-3 1-3 1-3 -  Sex activity preferences Insertive and receptive;Oral Insertive and receptive;Oral Insertive and receptive;Oral Receptive;Oral Insertive and receptive;Oral Insertive and receptive;Oral  Condom use Yes Yes Yes Yes - Yes  % condom use 99 100 100 100 - 100  Partners genders and ages - - M 30-49;M 50+ M 50+ M 50+ M 30-49  Treated for STI? No No No Yes No  -  HIV symptoms? N/A N/A - N/A - -  PrEP Eligibility Substantial risk for HIV - HIV negative;CrCl >60 ml/min;Substantial risk for HIV HIV negative;Substantial risk for HIV HIV negative;Substantial risk for HIV;CrCl > 60;No Symptoms of HIV HIV negative;Substantial risk for HIV;CrCl > 60  Paper work received? - - No Yes No No    Labs:  SCr: Lab Results  Component Value Date   CREATININE 1.27 03/11/2018   CREATININE 1.24 12/04/2017   CREATININE 1.13 03/26/2017   CREATININE 1.01 10/09/2016   HIV Lab Results  Component Value Date   HIV NON-REACTIVE 03/11/2018   HIV NON-REACTIVE 12/04/2017   HIV NON-REACTIVE 09/10/2017   HIV NON-REACTIVE 06/26/2017   HIV NON-REACTIVE 03/26/2017   Hepatitis B Lab Results  Component Value Date   HEPBSAB REACTIVE (A) 12/04/2017   HEPBSAG NEGATIVE 10/09/2016   Hepatitis C No results found for: HEPCAB, HCVRNAPCRQN Hepatitis A Lab Results  Component Value Date   HAV REACTIVE (A) 12/04/2017   RPR and STI Lab Results  Component Value Date   LABRPR NON-REACTIVE 03/11/2018   LABRPR NON-REACTIVE 12/04/2017   LABRPR NON-REACTIVE 06/26/2017   LABRPR NON-REACTIVE 03/26/2017   LABRPR NON REAC 01/14/2017    STI Results GC CT  03/11/2018 Negative Negative  03/11/2018 Negative Negative  03/11/2018 Negative Negative  12/04/2017 Negative Negative  12/04/2017 Negative Negative  12/04/2017 Negative Negative  09/10/2017 Negative Negative  06/26/2017 Negative Negative  06/26/2017 Negative Negative  06/26/2017 Negative Negative  03/26/2017 Negative Negative  03/26/2017 Negative Negative  03/26/2017 Negative Negative  01/14/2017 Negative Negative  01/14/2017 Negative Negative  01/14/2017 Negative Negative  10/09/2016 Negative Negative  10/09/2016 Negative Negative  10/09/2016 Negative Negative    Assessment: Phillip Calhoun is here today to follow-up for PrEP. He continues on Truvada with no issues and only one missed dose in the last 3 months.  He has no issues getting it  from the pharmacy and has plenty left at this time.  He actually has no issues from [facility] pharmacy but does need a new co-pay card, which I gave to him.  He has had ~3 partners in the last 3 months with 100% condom use but there was one encounter where the condom broke.  He is very vigilant with his condom use and states that is his main mode of protection and the PrEP is his back up. He asked about what to do if a condom broke again, and I  told him that PrEP would protect him as well but he could get tested after that if he wanted.  He remains a versatile partner.  Discussed switching to Descovy with him and he is agreeable. Explained the difference between Truvada and Descovy and told him to switch whenever he ran out of his Truvada. Some of his partners are new, so I will do STD testing again today.  Will also check a HIV antibody.  Plan: - HIV antibody, RPR, oral/urine/rectal swabs - Descovy x 3 months if remains HIV negative - F/u with me 2/28 at 915am  Cassie L. Kuppelweiser, PharmD, BCIDP, AAHIVP, CPP Infectious Diseases Clinical Pharmacist [facility] 06/12/2018, 3:02 PM

## 2018-06-15 ENCOUNTER — Telehealth: Payer: Self-pay | Admitting: Pharmacist

## 2018-06-15 DIAGNOSIS — Z7252 High risk homosexual behavior: Secondary | ICD-10-CM

## 2018-06-15 LAB — RPR: RPR Ser Ql: NONREACTIVE

## 2018-06-15 LAB — HIV ANTIBODY (ROUTINE TESTING W REFLEX): HIV 1&2 Ab, 4th Generation: NONREACTIVE

## 2018-06-15 MED ORDER — EMTRICITABINE-TENOFOVIR AF 200-25 MG PO TABS: 1.0000 | ORAL_TABLET | Freq: Every day | ORAL | 0 refills | Status: DC

## 2018-06-15 NOTE — Telephone Encounter (Signed)
Called patient to let him know that his HIV antibody was negative.  Will send in 3 months of Descovy to [facility].

## 2018-06-16 LAB — URINE CYTOLOGY ANCILLARY ONLY
Chlamydia: NEGATIVE
Neisseria Gonorrhea: NEGATIVE

## 2018-06-16 LAB — CYTOLOGY, (ORAL, ANAL, URETHRAL) ANCILLARY ONLY
CHLAMYDIA, DNA PROBE: NEGATIVE
CHLAMYDIA, DNA PROBE: NEGATIVE
NEISSERIA GONORRHEA: NEGATIVE
NEISSERIA GONORRHEA: NEGATIVE

## 2018-07-20 ENCOUNTER — Telehealth: Payer: Self-pay

## 2018-07-20 NOTE — Telephone Encounter (Signed)
Patient called starting he currently has an "itchy rash" after starting Descovy. Routing message to Pharmacist for advise.  Marylee Floras, LPN

## 2018-07-21 ENCOUNTER — Other Ambulatory Visit: Payer: Self-pay | Admitting: Pharmacist

## 2018-07-21 DIAGNOSIS — Z7252 High risk homosexual behavior: Secondary | ICD-10-CM

## 2018-07-21 MED ORDER — EMTRICITABINE-TENOFOVIR DF 200-300 MG PO TABS: 1.0000 | ORAL_TABLET | Freq: Every day | ORAL | 1 refills | Status: DC

## 2018-07-21 NOTE — Telephone Encounter (Signed)
Rash currently to bilateral arms/legs. Patient states has been ongoing for about 3 weeks and he has not recently used any new lotions/soaps/detergets. Patients states that we was leaving for a trip soon and would like to switch back to Truvada if rash was possibly linked to Descovy.  Marylee Floras, LPN

## 2018-07-21 NOTE — Telephone Encounter (Signed)
Did you happen to get anymore information about the rash? Location, how long, started any new detergents/lotions/shampoo, itchy?, red?, tried anything? Would think it probably isn't Descovy, but we can switch back to Truvada if he wants to.

## 2018-07-21 NOTE — Telephone Encounter (Signed)
Ok no problem.  I'll send in the Truvada prescription for him. Thank you so much!

## 2018-07-29 ENCOUNTER — Other Ambulatory Visit: Payer: Self-pay | Admitting: Pharmacist

## 2018-07-29 DIAGNOSIS — Z7252 High risk homosexual behavior: Secondary | ICD-10-CM

## 2018-09-03 ENCOUNTER — Other Ambulatory Visit (HOSPITAL_COMMUNITY)
Admission: RE | Admit: 2018-09-03 | Discharge: 2018-09-03 | Disposition: A | Discharge status: Home / Self Care | Payer: BC Managed Care – PPO | Source: Ambulatory Visit | Attending: Infectious Disease | Admitting: Infectious Disease

## 2018-09-03 ENCOUNTER — Ambulatory Visit (INDEPENDENT_AMBULATORY_CARE_PROVIDER_SITE_OTHER): Payer: BC Managed Care – PPO | Admitting: Pharmacist

## 2018-09-03 DIAGNOSIS — Z7252 High risk homosexual behavior: Secondary | ICD-10-CM | POA: Insufficient documentation

## 2018-09-03 NOTE — Progress Notes (Signed)
Date:  09/03/2018   HPI: Phillip Calhoun is a 48 y.o. male who presents to the [facility] pharmacy clinic for 3 month PrEP follow-up.  Insured   [x]    Uninsured  []    Patient Active Problem List   Diagnosis Date Noted  . High risk sexual behavior 10/09/2016  . Hypertension 10/09/2016  . 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 Migraine syndrome 10/09/2016    Patient's Medications  New Prescriptions   No medications on file  Previous Medications   AMINO ACIDS (L-CARNITINE PO)    Take 1,000 mg by mouth 3 (three) times daily.   ASCORBIC ACID (VITAMIN C) 1000 MG TABLET    Take 1,000 mg by mouth 3 (three) times daily.   COENZYME Q10 (CO Q10) 100 MG CAPS    Take 100 mg by mouth daily.   EMTRICITABINE-TENOFOVIR (TRUVADA) 200-300 MG TABLET    Take 1 tablet by mouth daily.   GLUTAMINE PO    Take 1,000 mg by mouth daily.   LISINOPRIL-HYDROCHLOROTHIAZIDE (PRINZIDE,ZESTORETIC) 10-12.5 MG TABLET    Take 1 tablet by mouth daily.   MULTIPLE VITAMIN (MULTIVITAMIN WITH MINERALS) TABS TABLET    Take 1 tablet by mouth daily.   PROMETHAZINE (PHENERGAN) 25 MG TABLET    Take 25 mg by mouth as needed for nausea. 1 tablet every 6 hours as needed for nausea   SAFFLOWER OIL (CLA) 1000 MG CAPS    Take 1,000 mg by mouth 3 (three) times daily.   SUMATRIPTAN (IMITREX) 100 MG TABLET    Take 100 mg by mouth as needed.  Modified Medications   No medications on file  Discontinued Medications   No medications on file    Allergies: No Known Allergies  Past Medical History: No past medical history on file.  Social History: Social History   Socioeconomic History  . Marital status: Divorced    Spouse name: Not on file  . Number of children: Not on file  . Years of education: Not on file  . Highest education level: Not on file  Occupational History  . Not on file  Social Needs  . Financial resource strain: Not on file  . Food insecurity:    Worry: Not on file    Inability: Not on file  .  Transportation needs:    Medical: Not on file    Non-medical: Not on file  Tobacco Use  . Smoking status: Not on file  Substance and Sexual Activity  . Alcohol use: Not on file  . Drug use: Not on file  . Sexual activity: Not on file  Lifestyle  . Physical activity:    Days per week: Not on file    Minutes per session: Not on file  . Stress: Not on file  Relationships  . Social connections:    Talks on phone: Not on file    Gets together: Not on file    Attends religious service: Not on file    Active member of club or organization: Not on file    Attends meetings of clubs or organizations: Not on file    Relationship status: Not on file  Other Topics Concern  . Not on file  Social History Narrative  . Not on file    [facility] HIV PREP FLOWSHEET RESULTS 09/03/2018 06/12/2018 03/11/2018 12/04/2017 09/10/2017 06/26/2017 03/26/2017  Insurance Status Insured Insured Insured Insured Insured Insured Insured  How did you hear? - - - Primary care referral Referral from primary - -  Gender at birth Male Male Male Male Male Male Male  Gender identity cis-Male cis-Male cis-Male cis-Male cis-Male cis-Male cis-Male  Risk for HIV - - - - - - In sexual relationship with HIV+ partner  Risk for HIV >5 partners in past 6 mos (regardless of condom use) - >5 partners in past 6 mos (regardless of condom use);Hx of STI;Condomless vaginal or anal intercourse In sexual relationship with HIV+ partner;Hx of STI In sexual relationship with HIV+ partner;Hx of STI In sexual relationship with HIV+ partner;Hx of STI -  Sex Partners Men only - Men only Men only Men only Men only Men only  # of sex partners - - - - - - 3  # sex partners past 3-6 mos 1-3 1-3 7-9 1-3 1-3 1-3 -  Sex activity preferences Insertive and receptive;Oral Insertive and receptive;Oral Insertive and receptive;Oral Insertive and receptive;Oral Receptive;Oral Insertive and receptive;Oral Insertive and receptive;Oral  Condom use Yes Yes Yes Yes Yes - Yes    % condom use 100 99 100 100 100 - 100  Partners genders and ages - - - M 30-49;M 50+ M 50+ M 50+ M 30-49  Treated for STI? No No No No Yes No -  HIV symptoms? N/A N/A N/A - N/A - -  PrEP Eligibility Substantial risk for HIV Substantial risk for HIV - HIV negative;CrCl >60 ml/min;Substantial risk for HIV HIV negative;Substantial risk for HIV HIV negative;Substantial risk for HIV;CrCl > 60;No Symptoms of HIV HIV negative;Substantial risk for HIV;CrCl > 60  Paper work received? - - - No Yes No No    Labs:  SCr: Lab Results  Component Value Date   CREATININE 1.27 03/11/2018   CREATININE 1.24 12/04/2017   CREATININE 1.13 03/26/2017   CREATININE 1.01 10/09/2016   HIV Lab Results  Component Value Date   HIV NON-REACTIVE 06/12/2018   HIV NON-REACTIVE 03/11/2018   HIV NON-REACTIVE 12/04/2017   HIV NON-REACTIVE 09/10/2017   HIV NON-REACTIVE 06/26/2017   Hepatitis B Lab Results  Component Value Date   HEPBSAB REACTIVE (A) 12/04/2017   HEPBSAG NEGATIVE 10/09/2016   Hepatitis C No results found for: HEPCAB, HCVRNAPCRQN Hepatitis A Lab Results  Component Value Date   HAV REACTIVE (A) 12/04/2017   RPR and STI Lab Results  Component Value Date   LABRPR NON-REACTIVE 06/12/2018   LABRPR NON-REACTIVE 03/11/2018   LABRPR NON-REACTIVE 12/04/2017   LABRPR NON-REACTIVE 06/26/2017   LABRPR NON-REACTIVE 03/26/2017    STI Results GC CT  06/12/2018 Negative Negative  06/12/2018 Negative Negative  06/12/2018 Negative Negative  03/11/2018 Negative Negative  03/11/2018 Negative Negative  03/11/2018 Negative Negative  12/04/2017 Negative Negative  12/04/2017 Negative Negative  12/04/2017 Negative Negative  09/10/2017 Negative Negative  06/26/2017 Negative Negative  06/26/2017 Negative Negative  06/26/2017 Negative Negative  03/26/2017 Negative Negative  03/26/2017 Negative Negative  03/26/2017 Negative Negative  01/14/2017 Negative Negative  01/14/2017 Negative Negative  01/14/2017 Negative  Negative  10/09/2016 Negative Negative    Assessment: Phillip Calhoun is here today for his 3 month PrEP follow-up appointment and lab work. He was switched to Descovy at his last appointment in December. He ended up with a rash shortly after starting the Descovy that would not go away, so we switched him back to Truvada per his request. The rash has since resolved after switching and he has no other issues today.  Will continue on Truvada for him since it seems like he cannot tolerate Descovy. No changes in his insurance or problems with the pharmacy.  He is about to go out of town to [place] to see his mother who just fell in her home.  He has had one partner since I saw him last and he is hoping that he will remain his only partner.  They use condoms 100% of time and he is very adamant about this.  He is a versatile partner and had STD swabs done at last visit but would like to be tested every 3 months. Will do labs today and see him back in 3 months. Of note, he likes a 90 day prescription sent in for refills.  Plan: - HIV antibody, BMET, RPR, urine/oral/rectal gonorrhea/chlamydia cytology today - Descovy x 3 months if HIV negative - F/u with me again 5/28 at 915am  Jacaden Forbush L. Lemon Sternberg, PharmD, BCIDP, AAHIVP, CPP Infectious Diseases Clinical Pharmacist [facility] 09/03/2018, 3:09 PM

## 2018-09-04 ENCOUNTER — Telehealth: Payer: Self-pay | Admitting: Pharmacist

## 2018-09-04 ENCOUNTER — Ambulatory Visit: Payer: BC Managed Care – PPO | Admitting: Pharmacist

## 2018-09-04 DIAGNOSIS — Z7252 High risk homosexual behavior: Secondary | ICD-10-CM

## 2018-09-04 LAB — BASIC METABOLIC PANEL
BUN: 20 mg/dL (ref 7–25)
CO2: 28 mmol/L (ref 20–32)
Calcium: 9.8 mg/dL (ref 8.6–10.3)
Chloride: 102 mmol/L (ref 98–110)
Creat: 1.2 mg/dL (ref 0.60–1.35)
Glucose, Bld: 78 mg/dL (ref 65–99)
Potassium: 4.4 mmol/L (ref 3.5–5.3)
Sodium: 137 mmol/L (ref 135–146)

## 2018-09-04 LAB — RPR: RPR Ser Ql: NONREACTIVE

## 2018-09-04 LAB — CYTOLOGY, (ORAL, ANAL, URETHRAL) ANCILLARY ONLY
Chlamydia: NEGATIVE
Chlamydia: NEGATIVE
Neisseria Gonorrhea: NEGATIVE
Neisseria Gonorrhea: NEGATIVE

## 2018-09-04 LAB — URINE CYTOLOGY ANCILLARY ONLY
Chlamydia: NEGATIVE
NEISSERIA GONORRHEA: NEGATIVE

## 2018-09-04 LAB — HIV ANTIBODY (ROUTINE TESTING W REFLEX): HIV: NONREACTIVE

## 2018-09-04 MED ORDER — EMTRICITABINE-TENOFOVIR DF 200-300 MG PO TABS: 1.0000 | ORAL_TABLET | Freq: Every day | ORAL | 0 refills | Status: DC

## 2018-09-04 NOTE — Telephone Encounter (Signed)
Called patient to let him know that his HIV antibody was negative.  Will send in 3 more months of Truvada.  

## 2018-12-03 ENCOUNTER — Ambulatory Visit (INDEPENDENT_AMBULATORY_CARE_PROVIDER_SITE_OTHER): Payer: BC Managed Care – PPO | Admitting: Pharmacist

## 2018-12-03 ENCOUNTER — Other Ambulatory Visit (HOSPITAL_COMMUNITY)
Admission: RE | Admit: 2018-12-03 | Discharge: 2018-12-03 | Disposition: A | Discharge status: Home / Self Care | Payer: BC Managed Care – PPO | Source: Ambulatory Visit | Attending: Infectious Disease | Admitting: Infectious Disease

## 2018-12-03 ENCOUNTER — Other Ambulatory Visit: Payer: Self-pay

## 2018-12-03 DIAGNOSIS — Z7252 High risk homosexual behavior: Secondary | ICD-10-CM

## 2018-12-03 MED ORDER — EMTRICITABINE-TENOFOVIR DF 200-300 MG PO TABS: 1.0000 | ORAL_TABLET | Freq: Every day | ORAL | 0 refills | Status: DC

## 2018-12-03 NOTE — Progress Notes (Signed)
Date:  12/04/2018   HPI: Phillip Calhoun is a 48 y.o. male who presents to the [facility] pharmacy clinic for 3 month PrEP follow-up.  Insured   [x]    Uninsured  []    Patient Active Problem List   Diagnosis Date Noted  . High risk sexual behavior 10/09/2016  . Hypertension 10/09/2016  . 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 Migraine syndrome 10/09/2016    Patient's Medications  New Prescriptions   No medications on file  Previous Medications   AMINO ACIDS (L-CARNITINE PO)    Take 1,000 mg by mouth 3 (three) times daily.   ASCORBIC ACID (VITAMIN C) 1000 MG TABLET    Take 1,000 mg by mouth 3 (three) times daily.   COENZYME Q10 (CO Q10) 100 MG CAPS    Take 100 mg by mouth daily.   GLUTAMINE PO    Take 1,000 mg by mouth daily.   LISINOPRIL-HYDROCHLOROTHIAZIDE (PRINZIDE,ZESTORETIC) 10-12.5 MG TABLET    Take 1 tablet by mouth daily.   MULTIPLE VITAMIN (MULTIVITAMIN WITH MINERALS) TABS TABLET    Take 1 tablet by mouth daily.   PROMETHAZINE (PHENERGAN) 25 MG TABLET    Take 25 mg by mouth as needed for nausea. 1 tablet every 6 hours as needed for nausea   SAFFLOWER OIL (CLA) 1000 MG CAPS    Take 1,000 mg by mouth 3 (three) times daily.   SUMATRIPTAN (IMITREX) 100 MG TABLET    Take 100 mg by mouth as needed.  Modified Medications   Modified Medication Previous Medication   EMTRICITABINE-TENOFOVIR (TRUVADA) 200-300 MG TABLET emtricitabine-tenofovir (TRUVADA) 200-300 MG tablet      Take 1 tablet by mouth daily.    Take 1 tablet by mouth daily.  Discontinued Medications   No medications on file    Allergies: No Known Allergies  Past Medical History: No past medical history on file.  Social History: Social History   Socioeconomic History  . Marital status: Divorced    Spouse name: Not on file  . Number of children: Not on file  . Years of education: Not on file  . Highest education level: Not on file  Occupational History  . Not on file  Social Needs  . Financial  resource strain: Not on file  . Food insecurity:    Worry: Not on file    Inability: Not on file  . Transportation needs:    Medical: Not on file    Non-medical: Not on file  Tobacco Use  . Smoking status: Not on file  Substance and Sexual Activity  . Alcohol use: Not on file  . Drug use: Not on file  . Sexual activity: Not on file  Lifestyle  . Physical activity:    Days per week: Not on file    Minutes per session: Not on file  . Stress: Not on file  Relationships  . Social connections:    Talks on phone: Not on file    Gets together: Not on file    Attends religious service: Not on file    Active member of club or organization: Not on file    Attends meetings of clubs or organizations: Not on file    Relationship status: Not on file  Other Topics Concern  . Not on file  Social History Narrative  . Not on file    [facility] 12/04/2018 09/03/2018 06/12/2018 03/11/2018 12/04/2017 09/10/2017 06/26/2017  Insurance Status Insured Insured Insured Insured Insured Insured Insured  How did you hear? - - - - Primary care referral Referral from primary -  Gender at birth Male Male Male Male Male Male Male  Gender identity cis-Male cis-Male cis-Male cis-Male cis-Male cis-Male cis-Male  Risk for HIV - - - - - - -  Risk for HIV >5 partners in past 6 mos (regardless of condom use) >5 partners in past 6 mos (regardless of condom use) - >5 partners in past 6 mos (regardless of condom use);Hx of STI;Condomless vaginal or anal intercourse In sexual relationship with HIV+ partner;Hx of STI In sexual relationship with HIV+ partner;Hx of STI In sexual relationship with HIV+ partner;Hx of STI  Sex Partners Men only Men only - Men only Men only Men only Men only  # of sex partners - - - - - - -  # sex partners past 3-6 mos 1-3 1-3 1-3 7-9 1-3 1-3 1-3  Sex activity preferences Insertive and receptive;Oral Insertive and receptive;Oral Insertive and receptive;Oral Insertive and  receptive;Oral Insertive and receptive;Oral Receptive;Oral Insertive and receptive;Oral  Condom use Yes Yes Yes Yes Yes Yes -  % condom use 100 100 99 100 100 100 -  Partners genders and ages - - - - M 30-49;M 50+ M 50+ M 50+  Treated for STI? No No No No No Yes No  HIV symptoms? N/A N/A N/A N/A - N/A -  PrEP Eligibility Substantial risk for HIV;HIV negative Substantial risk for HIV Substantial risk for HIV - HIV negative;CrCl >60 ml/min;Substantial risk for HIV HIV negative;Substantial risk for HIV HIV negative;Substantial risk for HIV;CrCl > 60;No Symptoms of HIV  Paper work received? - - - - No Yes No    Labs:  SCr: Lab Results  Component Value Date   CREATININE 1.20 09/03/2018   CREATININE 1.27 03/11/2018   CREATININE 1.24 12/04/2017   CREATININE 1.13 03/26/2017   CREATININE 1.01 10/09/2016   HIV Lab Results  Component Value Date   HIV NON-REACTIVE 12/03/2018   HIV NON-REACTIVE 09/03/2018   HIV NON-REACTIVE 06/12/2018   HIV NON-REACTIVE 03/11/2018   HIV NON-REACTIVE 12/04/2017   Hepatitis B Lab Results  Component Value Date   HEPBSAB REACTIVE (A) 12/04/2017   HEPBSAG NEGATIVE 10/09/2016   Hepatitis C No results found for: HEPCAB, HCVRNAPCRQN Hepatitis A Lab Results  Component Value Date   HAV REACTIVE (A) 12/04/2017   RPR and STI Lab Results  Component Value Date   LABRPR NON-REACTIVE 12/03/2018   LABRPR NON-REACTIVE 09/03/2018   LABRPR NON-REACTIVE 06/12/2018   LABRPR NON-REACTIVE 03/11/2018   LABRPR NON-REACTIVE 12/04/2017    STI Results GC CT  09/03/2018 Negative Negative  09/03/2018 Negative Negative  09/03/2018 Negative Negative  06/12/2018 Negative Negative  06/12/2018 Negative Negative  06/12/2018 Negative Negative  03/11/2018 Negative Negative  03/11/2018 Negative Negative  03/11/2018 Negative Negative  12/04/2017 Negative Negative  12/04/2017 Negative Negative  12/04/2017 Negative Negative  09/10/2017 Negative Negative  06/26/2017 Negative Negative   06/26/2017 Negative Negative  06/26/2017 Negative Negative  03/26/2017 Negative Negative  03/26/2017 Negative Negative  03/26/2017 Negative Negative  01/14/2017 Negative Negative    Assessment: Phillip Calhoun is here today for his 3 month PrEP follow-up and lab appointment. He is doing very well on Truvada and is having no issues taking it or tolerating it.  He prefers Truvada over Descovy due to a bad rash he developed when he was transitioned to Descovy.  He is not having any problems with [facility] and no changes in his insurance.  He prefers a 90  day Rx when sent in.  He has only had one partner since I last saw him with 100% condom use. He prefers to be tested for STDs at each visit. He is doing well during the COVID 19 pandemic and is enjoying working from home.  Will check labs today and see him back in 3 months.  Plan: - HIV antibody, RPR, urine/oral/rectal gonorrhea/chlamydia cytology today - Truvada x 3 months if HIV negative - F/u with me again 8/26 at 915am  Lunabella Badgett L. Vito Beg, PharmD, BCIDP, AAHIVP, CPP Infectious Diseases Clinical Pharmacist [facility] 12/04/2018, 11:35 AM

## 2018-12-04 ENCOUNTER — Telehealth: Payer: Self-pay | Admitting: Pharmacist

## 2018-12-04 LAB — HIV ANTIBODY (ROUTINE TESTING W REFLEX): HIV 1&2 Ab, 4th Generation: NONREACTIVE

## 2018-12-04 LAB — RPR: RPR Ser Ql: NONREACTIVE

## 2018-12-04 LAB — CYTOLOGY, (ORAL, ANAL, URETHRAL) ANCILLARY ONLY
Chlamydia: NEGATIVE
Chlamydia: NEGATIVE
Neisseria Gonorrhea: NEGATIVE
Neisseria Gonorrhea: NEGATIVE

## 2018-12-04 LAB — URINE CYTOLOGY ANCILLARY ONLY
Chlamydia: NEGATIVE
Neisseria Gonorrhea: NEGATIVE

## 2018-12-04 NOTE — Telephone Encounter (Signed)
Called patient to let him know that his HIV antibody was negative.  Will send in 3 more months of Truvada to [facility].  Patient prefers a 90 day Rx.

## 2019-02-22 ENCOUNTER — Other Ambulatory Visit: Payer: Self-pay | Admitting: Pharmacist

## 2019-02-22 DIAGNOSIS — Z7252 High risk homosexual behavior: Secondary | ICD-10-CM

## 2019-03-02 ENCOUNTER — Other Ambulatory Visit: Payer: Self-pay | Admitting: Pharmacist

## 2019-03-02 DIAGNOSIS — Z7252 High risk homosexual behavior: Secondary | ICD-10-CM

## 2019-03-02 NOTE — Progress Notes (Signed)
PrEP labs

## 2019-03-03 ENCOUNTER — Other Ambulatory Visit: Payer: Self-pay

## 2019-03-03 ENCOUNTER — Ambulatory Visit (INDEPENDENT_AMBULATORY_CARE_PROVIDER_SITE_OTHER): Payer: BC Managed Care – PPO | Admitting: Pharmacist

## 2019-03-03 ENCOUNTER — Other Ambulatory Visit (HOSPITAL_COMMUNITY)
Admission: RE | Admit: 2019-03-03 | Discharge: 2019-03-03 | Disposition: A | Discharge status: Home / Self Care | Payer: BC Managed Care – PPO | Source: Ambulatory Visit | Attending: Infectious Disease | Admitting: Infectious Disease

## 2019-03-03 DIAGNOSIS — Z7252 High risk homosexual behavior: Secondary | ICD-10-CM

## 2019-03-03 NOTE — Progress Notes (Signed)
Date:  03/04/2019   HPI: Phillip Calhoun is a 48 y.o. male who presents to the [facility] pharmacy clinic for 3 month PrEP follow-up.  Insured   [x]    Uninsured  []    Patient Active Problem List   Diagnosis Date Noted  . High risk sexual behavior 10/09/2016  . Hypertension 10/09/2016  . 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 Migraine syndrome 10/09/2016    Patient's Medications  New Prescriptions   No medications on file  Previous Medications   AMINO ACIDS (L-CARNITINE PO)    Take 1,000 mg by mouth 3 (three) times daily.   ASCORBIC ACID (VITAMIN C) 1000 MG TABLET    Take 1,000 mg by mouth 3 (three) times daily.   COENZYME Q10 (CO Q10) 100 MG CAPS    Take 100 mg by mouth daily.   EMTRICITABINE-TENOFOVIR (TRUVADA) 200-300 MG TABLET    Take 1 tablet by mouth daily.   GLUTAMINE PO    Take 1,000 mg by mouth daily.   LISINOPRIL-HYDROCHLOROTHIAZIDE (PRINZIDE,ZESTORETIC) 10-12.5 MG TABLET    Take 1 tablet by mouth daily.   MULTIPLE VITAMIN (MULTIVITAMIN WITH MINERALS) TABS TABLET    Take 1 tablet by mouth daily.   PROMETHAZINE (PHENERGAN) 25 MG TABLET    Take 25 mg by mouth as needed for nausea. 1 tablet every 6 hours as needed for nausea   SAFFLOWER OIL (CLA) 1000 MG CAPS    Take 1,000 mg by mouth 3 (three) times daily.   SUMATRIPTAN (IMITREX) 100 MG TABLET    Take 100 mg by mouth as needed.  Modified Medications   No medications on file  Discontinued Medications   No medications on file    Allergies: No Known Allergies  Past Medical History: No past medical history on file.  Social History: Social History   Socioeconomic History  . Marital status: Divorced    Spouse name: Not on file  . Number of children: Not on file  . Years of education: Not on file  . Highest education level: Not on file  Occupational History  . Not on file  Social Needs  . Financial resource strain: Not on file  . Food insecurity    Worry: Not on file    Inability: Not on file  .  Transportation needs    Medical: Not on file    Non-medical: Not on file  Tobacco Use  . Smoking status: Not on file  Substance and Sexual Activity  . Alcohol use: Not on file  . Drug use: Not on file  . Sexual activity: Not on file  Lifestyle  . Physical activity    Days per week: Not on file    Minutes per session: Not on file  . Stress: Not on file  Relationships  . Social Musician on phone: Not on file    Gets together: Not on file    Attends religious service: Not on file    Active member of club or organization: Not on file    Attends meetings of clubs or organizations: Not on file    Relationship status: Not on file  Other Topics Concern  . Not on file  Social History Narrative  . Not on file    [facility] HIV PREP FLOWSHEET RESULTS 03/04/2019 12/04/2018 09/03/2018 06/12/2018 03/11/2018 12/04/2017 09/10/2017  Insurance Status Insured Insured Insured Insured Insured Insured Insured  How did you hear? - - - - - Primary care referral Referral from primary  Gender at birth Male Male Male Male Male Male Male  Gender identity cis-Male cis-Male cis-Male cis-Male cis-Male cis-Male cis-Male  Risk for HIV - - - - - - -  Risk for HIV - >5 partners in past 6 mos (regardless of condom use) >5 partners in past 6 mos (regardless of condom use) - >5 partners in past 6 mos (regardless of condom use);Hx of STI;Condomless vaginal or anal intercourse In sexual relationship with HIV+ partner;Hx of STI In sexual relationship with HIV+ partner;Hx of STI  Sex Partners Men only Men only Men only - Men only Men only Men only  # of sex partners - - - - - - -  # sex partners past 3-6 mos 1-3 1-3 1-3 1-3 7-9 1-3 1-3  Sex activity preferences Insertive and receptive;Oral Insertive and receptive;Oral Insertive and receptive;Oral Insertive and receptive;Oral Insertive and receptive;Oral Insertive and receptive;Oral Receptive;Oral  Condom use Yes Yes Yes Yes Yes Yes Yes  % condom use 100 100 100 39 100 100  100  Partners genders and ages - - - - - M 30-49;M 38+ M 68+  Treated for STI? No No No No No No Yes  HIV symptoms? N/A N/A N/A N/A N/A - N/A  PrEP Eligibility Substantial risk for HIV Substantial risk for HIV;HIV negative Substantial risk for HIV Substantial risk for HIV - HIV negative;CrCl >60 ml/min;Substantial risk for HIV HIV negative;Substantial risk for HIV  Paper work received? - - - - - No Yes    Labs:  SCr: Lab Results  Component Value Date   CREATININE 1.10 03/03/2019   CREATININE 1.20 09/03/2018   CREATININE 1.27 03/11/2018   CREATININE 1.24 12/04/2017   CREATININE 1.13 03/26/2017   HIV Lab Results  Component Value Date   HIV NON-REACTIVE 03/03/2019   HIV NON-REACTIVE 12/03/2018   HIV NON-REACTIVE 09/03/2018   HIV NON-REACTIVE 06/12/2018   HIV NON-REACTIVE 03/11/2018   Hepatitis B Lab Results  Component Value Date   HEPBSAB REACTIVE (A) 12/04/2017   HEPBSAG NEGATIVE 10/09/2016   Hepatitis C No results found for: HEPCAB, HCVRNAPCRQN Hepatitis A Lab Results  Component Value Date   HAV REACTIVE (A) 12/04/2017   RPR and STI Lab Results  Component Value Date   LABRPR NON-REACTIVE 03/03/2019   LABRPR NON-REACTIVE 12/03/2018   LABRPR NON-REACTIVE 09/03/2018   LABRPR NON-REACTIVE 06/12/2018   LABRPR NON-REACTIVE 03/11/2018    STI Results GC CT  12/03/2018 Negative Negative  12/03/2018 Negative Negative  12/03/2018 Negative Negative  09/03/2018 Negative Negative  09/03/2018 Negative Negative  09/03/2018 Negative Negative  06/12/2018 Negative Negative  06/12/2018 Negative Negative  06/12/2018 Negative Negative  03/11/2018 Negative Negative  03/11/2018 Negative Negative  03/11/2018 Negative Negative  12/04/2017 Negative Negative  12/04/2017 Negative Negative  12/04/2017 Negative Negative  09/10/2017 Negative Negative  06/26/2017 Negative Negative  06/26/2017 Negative Negative  06/26/2017 Negative Negative  03/26/2017 Negative Negative    Assessment: Phillip Calhoun is  here today for his 3 month PrEP follow-up and lab appointment.  He continues on Truvada with no issues or side effects. He takes Truvada and not Descovy due to a rash he developed on the trial of Descovy.  He prefers a 90 day prescription through [facility] pharmacy.  He remains with just one partner and they use condoms 100% of the time.  He is, however, thinking of becoming more serious with him and trusts him enough to stop using condoms soon.  He is wondering if he still qualifies for PrEP.  I told  him yes and he was relieved because he wants to continue taking it even if he is in a monogamous relationship. He is not having any issues or concerns today.  Of note, the RPR lab costs him $49 and he wishes to do that every other time.  He is also getting a bill for $40 and will let me know what lab that is when I speak to him about his results.  Will check all labs today and see him back in 3 months.  Plan: - HIV antibody, BMET, RPR, urine/oral/rectal gonorrhea/chlamydia cytology today - Truvada x 3 months if HIV negative - F/u with me again 11/23 at 915am  Amayah Staheli L. Leily Capek, PharmD, BCIDP, AAHIVP, CPP Infectious Diseases Clinical Pharmacist [facility] 03/04/2019, 12:16 PM

## 2019-03-04 ENCOUNTER — Other Ambulatory Visit: Payer: Self-pay | Admitting: Pharmacist

## 2019-03-04 DIAGNOSIS — Z7252 High risk homosexual behavior: Secondary | ICD-10-CM

## 2019-03-04 LAB — BASIC METABOLIC PANEL
BUN: 15 mg/dL (ref 7–25)
CO2: 30 mmol/L (ref 20–32)
Calcium: 9.7 mg/dL (ref 8.6–10.3)
Chloride: 101 mmol/L (ref 98–110)
Creat: 1.1 mg/dL (ref 0.60–1.35)
Glucose, Bld: 80 mg/dL (ref 65–99)
Potassium: 4 mmol/L (ref 3.5–5.3)
Sodium: 137 mmol/L (ref 135–146)

## 2019-03-04 LAB — RPR: RPR Ser Ql: NONREACTIVE

## 2019-03-04 LAB — HIV ANTIBODY (ROUTINE TESTING W REFLEX): HIV 1&2 Ab, 4th Generation: NONREACTIVE

## 2019-03-04 LAB — CYTOLOGY, (ORAL, ANAL, URETHRAL) ANCILLARY ONLY
Chlamydia: NEGATIVE
Chlamydia: NEGATIVE
Neisseria Gonorrhea: NEGATIVE
Neisseria Gonorrhea: NEGATIVE

## 2019-03-04 LAB — URINE CYTOLOGY ANCILLARY ONLY
Chlamydia: NEGATIVE
Neisseria Gonorrhea: NEGATIVE

## 2019-03-04 MED ORDER — EMTRICITABINE-TENOFOVIR DF 200-300 MG PO TABS: 1.0000 | ORAL_TABLET | Freq: Every day | ORAL | 0 refills | Status: DC

## 2019-03-04 NOTE — Progress Notes (Signed)
Patient's HIV antibody is negative.  Will send in 3 more months of Truvada to [facility].  

## 2019-05-17 ENCOUNTER — Other Ambulatory Visit: Payer: Self-pay | Admitting: Pharmacist

## 2019-05-17 DIAGNOSIS — Z7252 High risk homosexual behavior: Secondary | ICD-10-CM

## 2019-05-31 ENCOUNTER — Other Ambulatory Visit: Payer: Self-pay

## 2019-05-31 ENCOUNTER — Other Ambulatory Visit (HOSPITAL_COMMUNITY)
Admission: RE | Admit: 2019-05-31 | Discharge: 2019-05-31 | Disposition: A | Discharge status: Home / Self Care | Payer: BC Managed Care – PPO | Source: Ambulatory Visit | Attending: Infectious Disease | Admitting: Infectious Disease

## 2019-05-31 ENCOUNTER — Ambulatory Visit (INDEPENDENT_AMBULATORY_CARE_PROVIDER_SITE_OTHER): Payer: BC Managed Care – PPO | Admitting: Pharmacist

## 2019-05-31 DIAGNOSIS — A64 Unspecified sexually transmitted disease: Secondary | ICD-10-CM | POA: Insufficient documentation

## 2019-05-31 DIAGNOSIS — Z79899 Other long term (current) drug therapy: Secondary | ICD-10-CM

## 2019-05-31 DIAGNOSIS — Z7252 High risk homosexual behavior: Secondary | ICD-10-CM | POA: Insufficient documentation

## 2019-05-31 NOTE — Progress Notes (Signed)
Date:  05/31/2019   HPI: Phillip Calhoun is a 48 y.o. male who presents to the [facility] for 3 month PrEP follow-up.  Insured   [x]    Uninsured  []    Patient Active Problem List   Diagnosis Date Noted  . High risk sexual behavior 10/09/2016  . Hypertension 10/09/2016  . 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 Migraine syndrome 10/09/2016    Patient's Medications  New Prescriptions   No medications on file  Previous Medications   AMINO ACIDS (L-CARNITINE PO)    Take 1,000 mg by mouth 3 (three) times daily.   ASCORBIC ACID (VITAMIN C) 1000 MG TABLET    Take 1,000 mg by mouth 3 (three) times daily.   COENZYME Q10 (CO Q10) 100 MG CAPS    Take 100 mg by mouth daily.   EMTRICITABINE-TENOFOVIR (TRUVADA) 200-300 MG TABLET    Take 1 tablet by mouth daily.   GLUTAMINE PO    Take 1,000 mg by mouth daily.   LISINOPRIL-HYDROCHLOROTHIAZIDE (PRINZIDE,ZESTORETIC) 10-12.5 MG TABLET    Take 1 tablet by mouth daily.   MULTIPLE VITAMIN (MULTIVITAMIN WITH MINERALS) TABS TABLET    Take 1 tablet by mouth daily.   PROMETHAZINE (PHENERGAN) 25 MG TABLET    Take 25 mg by mouth as needed for nausea. 1 tablet every 6 hours as needed for nausea   SAFFLOWER OIL (CLA) 1000 MG CAPS    Take 1,000 mg by mouth 3 (three) times daily.   SUMATRIPTAN (IMITREX) 100 MG TABLET    Take 100 mg by mouth as needed.  Modified Medications   No medications on file  Discontinued Medications   No medications on file    Allergies: No Known Allergies  Past Medical History: No past medical history on file.  Social History: Social History   Socioeconomic History  . Marital status: Divorced    Spouse name: Not on file  . Number of children: Not on file  . Years of education: Not on file  . Highest education level: Not on file  Occupational History  . Not on file  Social Needs  . Financial resource strain: Not on file  . Food insecurity    Worry: Not on file    Inability: Not on file  .  Transportation needs    Medical: Not on file    Non-medical: Not on file  Tobacco Use  . Smoking status: Not on file  Substance and Sexual Activity  . Alcohol use: Not on file  . Drug use: Not on file  . Sexual activity: Not on file  Lifestyle  . Physical activity    Days per week: Not on file    Minutes per session: Not on file  . Stress: Not on file  Relationships  . Social Herbalist on phone: Not on file    Gets together: Not on file    Attends religious service: Not on file    Active member of club or organization: Not on file    Attends meetings of clubs or organizations: Not on file    Relationship status: Not on file  Other Topics Concern  . Not on file  Social History Narrative  . Not on file    [facility] HIV PREP FLOWSHEET RESULTS 03/04/2019 12/04/2018 09/03/2018 06/12/2018 03/11/2018 12/04/2017 09/10/2017  Insurance Status Insured Insured Insured Insured Insured Insured Insured  How did you hear? - - - - - Primary care referral Referral from primary  Gender at birth Male Male Male Male Male Male Male  Gender identity cis-Male cis-Male cis-Male cis-Male cis-Male cis-Male cis-Male  Risk for HIV - - - - - - -  Risk for HIV - >5 partners in past 6 mos (regardless of condom use) >5 partners in past 6 mos (regardless of condom use) - >5 partners in past 6 mos (regardless of condom use);Hx of STI;Condomless vaginal or anal intercourse In sexual relationship with HIV+ partner;Hx of STI In sexual relationship with HIV+ partner;Hx of STI  Sex Partners Men only Men only Men only - Men only Men only Men only  # of sex partners - - - - - - -  # sex partners past 3-6 mos 1-3 1-3 1-3 1-3 7-9 1-3 1-3  Sex activity preferences Insertive and receptive;Oral Insertive and receptive;Oral Insertive and receptive;Oral Insertive and receptive;Oral Insertive and receptive;Oral Insertive and receptive;Oral Receptive;Oral  Condom use Yes Yes Yes Yes Yes Yes Yes  % condom use 100 100 100 99 100 100  100  Partners genders and ages - - - - - M 30-49;M 50+ M 50+  Treated for STI? No No No No No No Yes  HIV symptoms? N/A N/A N/A N/A N/A - N/A  PrEP Eligibility Substantial risk for HIV Substantial risk for HIV;HIV negative Substantial risk for HIV Substantial risk for HIV - HIV negative;CrCl >60 ml/min;Substantial risk for HIV HIV negative;Substantial risk for HIV  Paper work received? - - - - - No Yes    Labs:  SCr: Lab Results  Component Value Date   CREATININE 1.10 03/03/2019   CREATININE 1.20 09/03/2018   CREATININE 1.27 03/11/2018   CREATININE 1.24 12/04/2017   CREATININE 1.13 03/26/2017   HIV Lab Results  Component Value Date   HIV NON-REACTIVE 03/03/2019   HIV NON-REACTIVE 12/03/2018   HIV NON-REACTIVE 09/03/2018   HIV NON-REACTIVE 06/12/2018   HIV NON-REACTIVE 03/11/2018   Hepatitis B Lab Results  Component Value Date   HEPBSAB REACTIVE (A) 12/04/2017   HEPBSAG NEGATIVE 10/09/2016   Hepatitis C No results found for: HEPCAB, HCVRNAPCRQN Hepatitis A Lab Results  Component Value Date   HAV REACTIVE (A) 12/04/2017   RPR and STI Lab Results  Component Value Date   LABRPR NON-REACTIVE 03/03/2019   LABRPR NON-REACTIVE 12/03/2018   LABRPR NON-REACTIVE 09/03/2018   LABRPR NON-REACTIVE 06/12/2018   LABRPR NON-REACTIVE 03/11/2018    STI Results GC CT  03/03/2019 Negative Negative  03/03/2019 Negative Negative  03/03/2019 Negative Negative  12/03/2018 Negative Negative  12/03/2018 Negative Negative  12/03/2018 Negative Negative  09/03/2018 Negative Negative  09/03/2018 Negative Negative  09/03/2018 Negative Negative  06/12/2018 Negative Negative  06/12/2018 Negative Negative  06/12/2018 Negative Negative  03/11/2018 Negative Negative  03/11/2018 Negative Negative  03/11/2018 Negative Negative  12/04/2017 Negative Negative  12/04/2017 Negative Negative  12/04/2017 Negative Negative  09/10/2017 Negative Negative  06/26/2017 Negative Negative    Assessment: Phillip Calhoun is here  for his 3 month follow up PrEP appointment. He continues on Truvada with no issues or side effects. He does not take Descovy due to a rash he experienced on this medication. He is still with just one partner and they use condoms most of the time but not all the time. He is requesting to be tested for STDs, he does not have any symptoms or concerns today he just likes to be routinely tested. We will get labs and see him back in 3 months.   Plan: - Continue Truvada if HIV antibody negative -  HIV antibody, BMET, RPR, urine/oral/rectal cytology - Follow up with Cassie 08/25/2019   Jettie Paganyler Yerlin Gasparyan, PharmD PGY2 Infectious Disease Pharmacy Resident  [facility] 05/31/2019, 8:49 AM

## 2019-06-01 ENCOUNTER — Telehealth: Payer: Self-pay | Admitting: Pharmacist

## 2019-06-01 DIAGNOSIS — Z7252 High risk homosexual behavior: Secondary | ICD-10-CM

## 2019-06-01 LAB — CYTOLOGY, (ORAL, ANAL, URETHRAL) ANCILLARY ONLY
Chlamydia: NEGATIVE
Chlamydia: NEGATIVE
Comment: NEGATIVE
Comment: NEGATIVE
Comment: NORMAL
Comment: NORMAL
Neisseria Gonorrhea: NEGATIVE
Neisseria Gonorrhea: NEGATIVE

## 2019-06-01 LAB — URINE CYTOLOGY ANCILLARY ONLY
Chlamydia: NEGATIVE
Comment: NEGATIVE
Comment: NORMAL
Neisseria Gonorrhea: NEGATIVE

## 2019-06-01 LAB — BASIC METABOLIC PANEL
BUN: 15 mg/dL (ref 7–25)
CO2: 19 mmol/L — ABNORMAL LOW (ref 20–32)
Calcium: 9.7 mg/dL (ref 8.6–10.3)
Chloride: 101 mmol/L (ref 98–110)
Creat: 1.06 mg/dL (ref 0.60–1.35)
Glucose, Bld: 85 mg/dL (ref 65–99)
Potassium: 5.2 mmol/L (ref 3.5–5.3)
Sodium: 134 mmol/L — ABNORMAL LOW (ref 135–146)

## 2019-06-01 LAB — HIV ANTIBODY (ROUTINE TESTING W REFLEX): HIV 1&2 Ab, 4th Generation: NONREACTIVE

## 2019-06-01 LAB — RPR: RPR Ser Ql: NONREACTIVE

## 2019-06-01 MED ORDER — EMTRICITABINE-TENOFOVIR DF 200-300 MG PO TABS: 1.0000 | ORAL_TABLET | Freq: Every day | ORAL | 0 refills | Status: DC

## 2019-06-01 NOTE — Telephone Encounter (Signed)
Patient's HIV antibody is negative.  Will send in 3 more months of [facility] to [facility].

## 2019-06-15 ENCOUNTER — Telehealth: Payer: Self-pay | Admitting: Pharmacy Technician

## 2019-06-15 ENCOUNTER — Other Ambulatory Visit: Payer: Self-pay | Admitting: Pharmacist

## 2019-06-15 DIAGNOSIS — Z7252 High risk homosexual behavior: Secondary | ICD-10-CM

## 2019-06-15 MED ORDER — TRUVADA 200-300 MG PO TABS: 1.0000 | ORAL_TABLET | Freq: Every day | ORAL | 0 refills | Status: DC

## 2019-06-15 NOTE — Telephone Encounter (Addendum)
[facility] Patient Advocate Encounter   Was successful in obtaining a [employer] copay card for Truvada.   I have spoken with the patient and explained to him the specifics of the copay card. He called concerned that [facility] sent him 90-days of the generic and charged him $15.00. He wants the brand Truvada and the ability to use his copay card with [employer]. He is going to [facility] to address his concerns.      The billing information is as follows: RxBin: [id number] PCN: [id number] Member ID: [id number] Group ID: [id number]  His current balance is $6870 and on 07/09/2019 it will reset to $7200. This is the card linked to him for lifetime. A copy of this card has been sent to the patient via his confirmed email address.   Bartholomew Crews, CPhT Specialty Pharmacy Patient [facility] Phone: [phone] Fax: [phone] 06/15/2019 11:04 AM

## 2019-06-15 NOTE — Progress Notes (Signed)
Sending in Truvada brand per patient request.

## 2019-08-25 ENCOUNTER — Ambulatory Visit (INDEPENDENT_AMBULATORY_CARE_PROVIDER_SITE_OTHER): Payer: BC Managed Care – PPO | Admitting: Pharmacist

## 2019-08-25 ENCOUNTER — Other Ambulatory Visit: Payer: Self-pay

## 2019-08-25 ENCOUNTER — Other Ambulatory Visit: Payer: Self-pay | Admitting: Pharmacist

## 2019-08-25 DIAGNOSIS — Z7252 High risk homosexual behavior: Secondary | ICD-10-CM

## 2019-08-25 LAB — HIV ANTIBODY (ROUTINE TESTING W REFLEX): HIV 1&2 Ab, 4th Generation: NONREACTIVE

## 2019-08-25 MED ORDER — EMTRICITABINE-TENOFOVIR DF 200-300 MG PO TABS: 1.0000 | ORAL_TABLET | Freq: Every day | ORAL | 0 refills | Status: DC

## 2019-08-25 NOTE — Telephone Encounter (Signed)
[facility] Patient Advocate Encounter   I was successful in securing patient a $7500 grant from [employer] ([employer]) to provide copayment coverage for generic Truvada coverage. This will make the out of pocket cost $0.     I have spoken with the patient and he will communicate with [facility] the new billing information.    The billing information is RxBin: [id number] PCN: [id number] Member ID: [id number] Group ID: [id number] Dates of Eligibility: 08/25/2019 through 08/24/2020  Patient knows to call the office with questions or concerns.  Beulah Gandy, CPhT Specialty Pharmacy Patient [facility] Phone: [phone] Fax: [phone] 08/25/2019 10:10 AM

## 2019-08-25 NOTE — Progress Notes (Signed)
Date:  08/26/2019   HPI: Phillip Calhoun is a 49 y.o. male who presents to the [facility] clinic for 3 month PrEP follow-up.  Insured   [x]    Uninsured  []    Patient Active Problem List   Diagnosis Date Noted  . High risk sexual behavior 10/09/2016  . Hypertension 10/09/2016  . 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 Migraine syndrome 10/09/2016    Patient's Medications  New Prescriptions   EMTRICITABINE-TENOFOVIR (TRUVADA) 200-300 MG TABLET    Take 1 tablet by mouth daily.  Previous Medications   AMINO ACIDS (L-CARNITINE PO)    Take 1,000 mg by mouth 3 (three) times daily.   ASCORBIC ACID (VITAMIN C) 1000 MG TABLET    Take 1,000 mg by mouth 3 (three) times daily.   COENZYME Q10 (CO Q10) 100 MG CAPS    Take 100 mg by mouth daily.   GLUTAMINE PO    Take 1,000 mg by mouth daily.   LISINOPRIL-HYDROCHLOROTHIAZIDE (PRINZIDE,ZESTORETIC) 10-12.5 MG TABLET    Take 1 tablet by mouth daily.   MULTIPLE VITAMIN (MULTIVITAMIN WITH MINERALS) TABS TABLET    Take 1 tablet by mouth daily.   PROMETHAZINE (PHENERGAN) 25 MG TABLET    Take 25 mg by mouth as needed for nausea. 1 tablet every 6 hours as needed for nausea   SAFFLOWER OIL (CLA) 1000 MG CAPS    Take 1,000 mg by mouth 3 (three) times daily.   SUMATRIPTAN (IMITREX) 100 MG TABLET    Take 100 mg by mouth as needed.  Modified Medications   No medications on file  Discontinued Medications   TRUVADA 200-300 MG TABLET    Take 1 tablet by mouth daily.    Allergies: No Known Allergies  Past Medical History: No past medical history on file.  Social History: Social History   Socioeconomic History  . Marital status: Divorced    Spouse name: Not on file  . Number of children: Not on file  . Years of education: Not on file  . Highest education level: Not on file  Occupational History  . Not on file  Tobacco Use  . Smoking status: Not on file  Substance and Sexual Activity  . Alcohol use: Not on file  . Drug use: Not on  file  . Sexual activity: Not on file  Other Topics Concern  . Not on file  Social History Narrative  . Not on file   Social Determinants of Health   Financial Resource Strain:   . Difficulty of Paying Living Expenses: Not on file  Food Insecurity:   . Worried About 12/09/2016 in the Last Year: Not on file  . Ran Out of Food in the Last Year: Not on file  Transportation Needs:   . Lack of Transportation (Medical): Not on file  . Lack of Transportation (Non-Medical): Not on file  Physical Activity:   . Days of Exercise per Week: Not on file  . Minutes of Exercise per Session: Not on file  Stress:   . Feeling of Stress : Not on file  Social Connections:   . Frequency of Communication with Friends and Family: Not on file  . Frequency of Social Gatherings with Friends and Family: Not on file  . Attends Religious Services: Not on file  . Active Member of Clubs or Organizations: Not on file  . Attends 12/09/2016 Meetings: Not on file  . Marital Status: Not on file  [facility] HIV PREP FLOWSHEET RESULTS 03/04/2019 12/04/2018 09/03/2018 06/12/2018 03/11/2018 12/04/2017 09/10/2017  Insurance Status Insured Insured Insured Insured Insured Insured Insured  How did you hear? - - - - - Primary care referral Referral from primary  Gender at birth Male Male Male Male Male Male Male  Gender identity cis-Male cis-Male cis-Male cis-Male cis-Male cis-Male cis-Male  Risk for HIV - - - - - - -  Risk for HIV - >5 partners in past 6 mos (regardless of condom use) >5 partners in past 6 mos (regardless of condom use) - >5 partners in past 6 mos (regardless of condom use);Hx of STI;Condomless vaginal or anal intercourse In sexual relationship with HIV+ partner;Hx of STI In sexual relationship with HIV+ partner;Hx of STI  Sex Partners Men only Men only Men only - Men only Men only Men only  # of sex partners - - - - - - -  # sex partners past 3-6 mos 1-3 1-3 1-3 1-3 7-9 1-3 1-3  Sex activity  preferences Insertive and receptive;Oral Insertive and receptive;Oral Insertive and receptive;Oral Insertive and receptive;Oral Insertive and receptive;Oral Insertive and receptive;Oral Receptive;Oral  Condom use Yes Yes Yes Yes Yes Yes Yes  % condom use 100 100 100 22 100 100 100  Partners genders and ages - - - - - M 30-49;M 66+ M 67+  Treated for STI? No No No No No No Yes  HIV symptoms? N/A N/A N/A N/A N/A - N/A  PrEP Eligibility Substantial risk for HIV Substantial risk for HIV;HIV negative Substantial risk for HIV Substantial risk for HIV - HIV negative;CrCl >60 ml/min;Substantial risk for HIV HIV negative;Substantial risk for HIV  Paper work received? - - - - - No Yes    Labs:  SCr: Lab Results  Component Value Date   CREATININE 1.06 05/31/2019   CREATININE 1.10 03/03/2019   CREATININE 1.20 09/03/2018   CREATININE 1.27 03/11/2018   CREATININE 1.24 12/04/2017   HIV Lab Results  Component Value Date   HIV NON-REACTIVE 08/25/2019   HIV NON-REACTIVE 05/31/2019   HIV NON-REACTIVE 03/03/2019   HIV NON-REACTIVE 12/03/2018   HIV NON-REACTIVE 09/03/2018   Hepatitis B Lab Results  Component Value Date   HEPBSAB REACTIVE (A) 12/04/2017   HEPBSAG NEGATIVE 10/09/2016   Hepatitis C No results found for: HEPCAB, HCVRNAPCRQN Hepatitis A Lab Results  Component Value Date   HAV REACTIVE (A) 12/04/2017   RPR and STI Lab Results  Component Value Date   LABRPR NON-REACTIVE 05/31/2019   LABRPR NON-REACTIVE 03/03/2019   LABRPR NON-REACTIVE 12/03/2018   LABRPR NON-REACTIVE 09/03/2018   LABRPR NON-REACTIVE 06/12/2018    STI Results GC CT  05/31/2019 Negative Negative  05/31/2019 Negative Negative  05/31/2019 Negative Negative  03/03/2019 Negative Negative  03/03/2019 Negative Negative  03/03/2019 Negative Negative  12/03/2018 Negative Negative  12/03/2018 Negative Negative  12/03/2018 Negative Negative  09/03/2018 Negative Negative  09/03/2018 Negative Negative  09/03/2018  Negative Negative  06/12/2018 Negative Negative  06/12/2018 Negative Negative  06/12/2018 Negative Negative  03/11/2018 Negative Negative  03/11/2018 Negative Negative  03/11/2018 Negative Negative  12/04/2017 Negative Negative  12/04/2017 Negative Negative    Assessment: Phillip Calhoun is here today to follow up for HIV PrEP therapy. He continues on Truvada with no issues or problems.  He had a reaction (rash) to Descovy last year so he elected to go back on Truvada.  His insurance is requiring generic Truvada and because of this, he must pay $15 for the medication.  Adonis Brook was able to  get him help for this copay through PAF. No new partner or issues today. He requests that we do STI screening every 6 months.   Plan: - HIV antibody today - Descovy x 3 months if HIV negative - F/u with me again 5/12 at 915am  Lynzee Lindquist L. Avion Patella, PharmD, BCIDP, AAHIVP, CPP Clinical Pharmacist Practitioner Infectious Diseases Clinical Pharmacist [facility] 08/26/2019, 10:10 AM

## 2019-08-25 NOTE — Progress Notes (Signed)
Patient's HIV antibody is negative.  Will [facility] in [facility] months of [facility]  

## 2019-11-16 ENCOUNTER — Telehealth: Payer: Self-pay

## 2019-11-16 NOTE — Telephone Encounter (Signed)
COVID-19 Pre-Screening Questions:11/16/19  Do you currently have a fever (>100 F), chills or unexplained body aches?NO  Are you currently experiencing new cough, shortness of breath, sore throat, runny nose? NO  .  Have you recently travelled outside the state of [place] in the last 14 days?NO .  Have you been in contact with someone that is currently pending confirmation of Covid19 testing or has been confirmed to have the Covid19 virus?  NO  **If the patient answers NO to ALL questions -  advise the patient to please call the clinic before coming to the office should any symptoms develop.     

## 2019-11-17 ENCOUNTER — Other Ambulatory Visit (HOSPITAL_COMMUNITY)
Admission: RE | Admit: 2019-11-17 | Discharge: 2019-11-17 | Disposition: A | Discharge status: Home / Self Care | Payer: BC Managed Care – PPO | Source: Ambulatory Visit | Attending: Infectious Disease | Admitting: Infectious Disease

## 2019-11-17 ENCOUNTER — Other Ambulatory Visit: Payer: Self-pay

## 2019-11-17 ENCOUNTER — Ambulatory Visit (INDEPENDENT_AMBULATORY_CARE_PROVIDER_SITE_OTHER): Payer: BC Managed Care – PPO | Admitting: Pharmacist

## 2019-11-17 DIAGNOSIS — Z7252 High risk homosexual behavior: Secondary | ICD-10-CM

## 2019-11-17 DIAGNOSIS — A64 Unspecified sexually transmitted disease: Secondary | ICD-10-CM

## 2019-11-17 NOTE — Progress Notes (Signed)
Date:  11/17/2019   HPI: Phillip Calhoun is a 49 y.o. male who presents to the [facility] clinic for 3 month PrEP follow-up.  Insured   [x]    Uninsured  []    Patient Active Problem List   Diagnosis Date Noted  . High risk sexual behavior 10/09/2016  . Hypertension 10/09/2016  . 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 Migraine syndrome 10/09/2016    Patient's Medications  New Prescriptions   No medications on file  Previous Medications   AMINO ACIDS (L-CARNITINE PO)    Take 1,000 mg by mouth 3 (three) times daily.   ASCORBIC ACID (VITAMIN C) 1000 MG TABLET    Take 1,000 mg by mouth 3 (three) times daily.   COENZYME Q10 (CO Q10) 100 MG CAPS    Take 100 mg by mouth daily.   EMTRICITABINE-TENOFOVIR (TRUVADA) 200-300 MG TABLET    Take 1 tablet by mouth daily.   GLUTAMINE PO    Take 1,000 mg by mouth daily.   LISINOPRIL-HYDROCHLOROTHIAZIDE (PRINZIDE,ZESTORETIC) 10-12.5 MG TABLET    Take 1 tablet by mouth daily.   MULTIPLE VITAMIN (MULTIVITAMIN WITH MINERALS) TABS TABLET    Take 1 tablet by mouth daily.   PROMETHAZINE (PHENERGAN) 25 MG TABLET    Take 25 mg by mouth as needed for nausea. 1 tablet every 6 hours as needed for nausea   SAFFLOWER OIL (CLA) 1000 MG CAPS    Take 1,000 mg by mouth 3 (three) times daily.   SUMATRIPTAN (IMITREX) 100 MG TABLET    Take 100 mg by mouth as needed.  Modified Medications   No medications on file  Discontinued Medications   No medications on file    Allergies: No Known Allergies  Past Medical History: No past medical history on file.  Social History: Social History   Socioeconomic History  . Marital status: Divorced    Spouse name: Not on file  . Number of children: Not on file  . Years of education: Not on file  . Highest education level: Not on file  Occupational History  . Not on file  Tobacco Use  . Smoking status: Not on file  Substance and Sexual Activity  . Alcohol use: Not on file  . Drug use: Not on file  .  Sexual activity: Not on file  Other Topics Concern  . Not on file  Social History Narrative  . Not on file   Social Determinants of Health   Financial Resource Strain:   . Difficulty of Paying Living Expenses:   Food Insecurity:   . Worried About 12/09/2016 in the Last Year:   . 12/09/2016 in the Last Year:   Transportation Needs:   . Programme researcher, broadcasting/film/video (Medical):   Barista Lack of Transportation (Non-Medical):   Physical Activity:   . Days of Exercise per Week:   . Minutes of Exercise per Session:   Stress:   . Feeling of Stress :   Social Connections:   . Frequency of Communication with Friends and Family:   . Frequency of Social Gatherings with Friends and Family:   . Attends Religious Services:   . Active Member of Clubs or Organizations:   . Attends Freight forwarder Meetings:   Marland Kitchen Marital Status:     [facility] HIV PREP FLOWSHEET RESULTS 03/04/2019 12/04/2018 09/03/2018 06/12/2018 03/11/2018 12/04/2017 09/10/2017  Insurance Status Insured Insured Insured Insured Insured Insured Insured  How did you hear? - - - - -  Primary care referral Referral from primary  Gender at birth Male Male Male Male Male Male Male  Gender identity cis-Male cis-Male cis-Male cis-Male cis-Male cis-Male cis-Male  Risk for HIV - - - - - - -  Risk for HIV - >5 partners in past 6 mos (regardless of condom use) >5 partners in past 6 mos (regardless of condom use) - >5 partners in past 6 mos (regardless of condom use);Hx of STI;Condomless vaginal or anal intercourse In sexual relationship with HIV+ partner;Hx of STI In sexual relationship with HIV+ partner;Hx of STI  Sex Partners Men only Men only Men only - Men only Men only Men only  # of sex partners - - - - - - -  # sex partners past 3-6 mos 1-3 1-3 1-3 1-3 7-9 1-3 1-3  Sex activity preferences Insertive and receptive;Oral Insertive and receptive;Oral Insertive and receptive;Oral Insertive and receptive;Oral Insertive and receptive;Oral Insertive  and receptive;Oral Receptive;Oral  Condom use Yes Yes Yes Yes Yes Yes Yes  % condom use 100 100 100 99 100 100 100  Partners genders and ages - - - - - M 30-49;M 50+ M 50+  Treated for STI? No No No No No No Yes  HIV symptoms? N/A N/A N/A N/A N/A - N/A  PrEP Eligibility Substantial risk for HIV Substantial risk for HIV;HIV negative Substantial risk for HIV Substantial risk for HIV - HIV negative;CrCl >60 ml/min;Substantial risk for HIV HIV negative;Substantial risk for HIV  Paper work received? - - - - - No Yes    Labs:  SCr: Lab Results  Component Value Date   CREATININE 1.06 05/31/2019   CREATININE 1.10 03/03/2019   CREATININE 1.20 09/03/2018   CREATININE 1.27 03/11/2018   CREATININE 1.24 12/04/2017   HIV Lab Results  Component Value Date   HIV NON-REACTIVE 08/25/2019   HIV NON-REACTIVE 05/31/2019   HIV NON-REACTIVE 03/03/2019   HIV NON-REACTIVE 12/03/2018   HIV NON-REACTIVE 09/03/2018   Hepatitis B Lab Results  Component Value Date   HEPBSAB REACTIVE (A) 12/04/2017   HEPBSAG NEGATIVE 10/09/2016   Hepatitis C No results found for: HEPCAB, HCVRNAPCRQN Hepatitis A Lab Results  Component Value Date   HAV REACTIVE (A) 12/04/2017   RPR and STI Lab Results  Component Value Date   LABRPR NON-REACTIVE 05/31/2019   LABRPR NON-REACTIVE 03/03/2019   LABRPR NON-REACTIVE 12/03/2018   LABRPR NON-REACTIVE 09/03/2018   LABRPR NON-REACTIVE 06/12/2018    STI Results GC CT  05/31/2019 Negative Negative  05/31/2019 Negative Negative  05/31/2019 Negative Negative  03/03/2019 Negative Negative  03/03/2019 Negative Negative  03/03/2019 Negative Negative  12/03/2018 Negative Negative  12/03/2018 Negative Negative  12/03/2018 Negative Negative  09/03/2018 Negative Negative  09/03/2018 Negative Negative  09/03/2018 Negative Negative  06/12/2018 Negative Negative  06/12/2018 Negative Negative  06/12/2018 Negative Negative  03/11/2018 Negative Negative  03/11/2018 Negative Negative    03/11/2018 Negative Negative  12/04/2017 Negative Negative  12/04/2017 Negative Negative    Assessment: Phillip Calhoun presents today for his 20-month PrEP follow-up appointment. He continues to tolerate Truvada with no complaints or side-effects. He is still with the same one partner. He would like to be tested for STDs today and is not experiencing any symptoms. We will get labs today and see him back in 3 months.   Plan: - Continue Truvada if HIV antibody negative - HIV antibody, BMET, RPR, urine/oral/rectal cytology - F/u with Cassie on 02/16/20 at 8:30AM  Fabio Neighbors, PharmD PGY1 Ambulatory Care Resident [facility]  11/17/2019, 9:31 AM

## 2019-11-17 NOTE — Addendum Note (Signed)
Addended by: Robinette Haines on: 11/17/2019 09:15 AM   Modules accepted: Orders

## 2019-11-18 ENCOUNTER — Other Ambulatory Visit: Payer: Self-pay | Admitting: Pharmacist

## 2019-11-18 ENCOUNTER — Telehealth: Payer: Self-pay | Admitting: Pharmacist

## 2019-11-18 DIAGNOSIS — Z7252 High risk homosexual behavior: Secondary | ICD-10-CM

## 2019-11-18 LAB — URINE CYTOLOGY ANCILLARY ONLY
Chlamydia: NEGATIVE
Comment: NEGATIVE
Comment: NORMAL
Neisseria Gonorrhea: NEGATIVE

## 2019-11-18 LAB — BASIC METABOLIC PANEL
BUN: 22 mg/dL (ref 7–25)
CO2: 28 mmol/L (ref 20–32)
Calcium: 10 mg/dL (ref 8.6–10.3)
Chloride: 100 mmol/L (ref 98–110)
Creat: 1.15 mg/dL (ref 0.60–1.35)
Glucose, Bld: 90 mg/dL (ref 65–99)
Potassium: 4.5 mmol/L (ref 3.5–5.3)
Sodium: 135 mmol/L (ref 135–146)

## 2019-11-18 LAB — CYTOLOGY, (ORAL, ANAL, URETHRAL) ANCILLARY ONLY
Chlamydia: NEGATIVE
Chlamydia: NEGATIVE
Comment: NEGATIVE
Comment: NEGATIVE
Comment: NORMAL
Comment: NORMAL
Neisseria Gonorrhea: NEGATIVE
Neisseria Gonorrhea: NEGATIVE

## 2019-11-18 LAB — RPR: RPR Ser Ql: NONREACTIVE

## 2019-11-18 LAB — HIV ANTIBODY (ROUTINE TESTING W REFLEX): HIV 1&2 Ab, 4th Generation: NONREACTIVE

## 2019-11-18 MED ORDER — EMTRICITABINE-TENOFOVIR DF 200-300 MG PO TABS: 1.0000 | ORAL_TABLET | Freq: Every day | ORAL | 0 refills | Status: DC

## 2019-11-18 NOTE — Progress Notes (Signed)
Patient's HIV antibody is negative.  Will send in 3 more months of [facility] to [facility].

## 2019-11-18 NOTE — Telephone Encounter (Cosign Needed Addendum)
Informed patient that HIV antibody and STD results are all negative. Will send Truvada x 3 months to [facility].   Fabio Neighbors, PharmD PGY1 Ambulatory Care Resident

## 2020-02-01 ENCOUNTER — Other Ambulatory Visit: Payer: Self-pay | Admitting: Pharmacist

## 2020-02-01 DIAGNOSIS — Z7252 High risk homosexual behavior: Secondary | ICD-10-CM

## 2020-02-15 ENCOUNTER — Other Ambulatory Visit: Payer: Self-pay | Admitting: Pharmacist

## 2020-02-15 DIAGNOSIS — Z7252 High risk homosexual behavior: Secondary | ICD-10-CM

## 2020-02-16 ENCOUNTER — Other Ambulatory Visit: Payer: Self-pay | Admitting: Pharmacist

## 2020-02-16 ENCOUNTER — Ambulatory Visit (INDEPENDENT_AMBULATORY_CARE_PROVIDER_SITE_OTHER): Payer: BC Managed Care – PPO | Admitting: Pharmacist

## 2020-02-16 ENCOUNTER — Other Ambulatory Visit: Payer: Self-pay

## 2020-02-16 DIAGNOSIS — Z7252 High risk homosexual behavior: Secondary | ICD-10-CM

## 2020-02-16 DIAGNOSIS — Z79899 Other long term (current) drug therapy: Secondary | ICD-10-CM | POA: Diagnosis not present

## 2020-02-16 NOTE — Progress Notes (Signed)
Date:  02/16/2020   HPI: Phillip Calhoun is a 49 y.o. male who presents to the [facility] pharmacy clinic for 3 month PrEP follow-up.  Insured   [x]    Uninsured  []    Patient Active Problem List   Diagnosis Date Noted  . High risk sexual behavior 10/09/2016  . Hypertension 10/09/2016  . 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 Migraine syndrome 10/09/2016    Patient's Medications  New Prescriptions   No medications on file  Previous Medications   AMINO ACIDS (L-CARNITINE PO)    Take 1,000 mg by mouth 3 (three) times daily.   ASCORBIC ACID (VITAMIN C) 1000 MG TABLET    Take 1,000 mg by mouth 3 (three) times daily.   COENZYME Q10 (CO Q10) 100 MG CAPS    Take 100 mg by mouth daily.   EMTRICITABINE-TENOFOVIR (TRUVADA) 200-300 MG TABLET    Take 1 tablet by mouth daily.   GLUTAMINE PO    Take 1,000 mg by mouth daily.   LISINOPRIL-HYDROCHLOROTHIAZIDE (PRINZIDE,ZESTORETIC) 10-12.5 MG TABLET    Take 1 tablet by mouth daily.   MULTIPLE VITAMIN (MULTIVITAMIN WITH MINERALS) TABS TABLET    Take 1 tablet by mouth daily.   PROMETHAZINE (PHENERGAN) 25 MG TABLET    Take 25 mg by mouth as needed for nausea. 1 tablet every 6 hours as needed for nausea   SAFFLOWER OIL (CLA) 1000 MG CAPS    Take 1,000 mg by mouth 3 (three) times daily.   SUMATRIPTAN (IMITREX) 100 MG TABLET    Take 100 mg by mouth as needed.  Modified Medications   No medications on file  Discontinued Medications   No medications on file    Allergies: No Known Allergies  Past Medical History: No past medical history on file.  Social History: Social History   Socioeconomic History  . Marital status: Divorced    Spouse name: Not on file  . Number of children: Not on file  . Years of education: Not on file  . Highest education level: Not on file  Occupational History  . Not on file  Tobacco Use  . Smoking status: Not on file  Substance and Sexual Activity  . Alcohol use: Not on file  . Drug use: Not on file  .  Sexual activity: Not on file  Other Topics Concern  . Not on file  Social History Narrative  . Not on file   Social Determinants of Health   Financial Resource Strain:   . Difficulty of Paying Living Expenses:   Food Insecurity:   . Worried About 12/09/2016 in the Last Year:   . 12/09/2016 in the Last Year:   Transportation Needs:   . Programme researcher, broadcasting/film/video (Medical):   Barista Lack of Transportation (Non-Medical):   Physical Activity:   . Days of Exercise per Week:   . Minutes of Exercise per Session:   Stress:   . Feeling of Stress :   Social Connections:   . Frequency of Communication with Friends and Family:   . Frequency of Social Gatherings with Friends and Family:   . Attends Religious Services:   . Active Member of Clubs or Organizations:   . Attends Freight forwarder Meetings:   Marland Kitchen Marital Status:     CHL HIV PREP FLOWSHEET RESULTS 03/04/2019 12/04/2018 09/03/2018 06/12/2018 03/11/2018 12/04/2017 09/10/2017  Insurance Status Insured Insured Insured Insured Insured Insured Insured  How did you hear? - - - - -  Primary care referral Referral from primary  Gender at birth Male Male Male Male Male Male Male  Gender identity cis-Male cis-Male cis-Male cis-Male cis-Male cis-Male cis-Male  Risk for HIV - - - - - - -  Risk for HIV - >5 partners in past 6 mos (regardless of condom use) >5 partners in past 6 mos (regardless of condom use) - >5 partners in past 6 mos (regardless of condom use);Hx of STI;Condomless vaginal or anal intercourse In sexual relationship with HIV+ partner;Hx of STI In sexual relationship with HIV+ partner;Hx of STI  Sex Partners Men only Men only Men only - Men only Men only Men only  # of sex partners - - - - - - -  # sex partners past 3-6 mos 1-3 1-3 1-3 1-3 7-9 1-3 1-3  Sex activity preferences Insertive and receptive;Oral Insertive and receptive;Oral Insertive and receptive;Oral Insertive and receptive;Oral Insertive and receptive;Oral Insertive  and receptive;Oral Receptive;Oral  Condom use Yes Yes Yes Yes Yes Yes Yes  % condom use 100 100 100 99 100 100 100  Partners genders and ages - - - - - M 30-49;M 50+ M 50+  Treated for STI? No No No No No No Yes  HIV symptoms? N/A N/A N/A N/A N/A - N/A  PrEP Eligibility Substantial risk for HIV Substantial risk for HIV;HIV negative Substantial risk for HIV Substantial risk for HIV - HIV negative;CrCl >60 ml/min;Substantial risk for HIV HIV negative;Substantial risk for HIV  Paper work received? - - - - - No Yes    Labs:  SCr: Lab Results  Component Value Date   CREATININE 1.15 11/17/2019   CREATININE 1.06 05/31/2019   CREATININE 1.10 03/03/2019   CREATININE 1.20 09/03/2018   CREATININE 1.27 03/11/2018   HIV Lab Results  Component Value Date   HIV NON-REACTIVE 11/17/2019   HIV NON-REACTIVE 08/25/2019   HIV NON-REACTIVE 05/31/2019   HIV NON-REACTIVE 03/03/2019   HIV NON-REACTIVE 12/03/2018   Hepatitis B Lab Results  Component Value Date   HEPBSAB REACTIVE (A) 12/04/2017   HEPBSAG NEGATIVE 10/09/2016   Hepatitis C No results found for: HEPCAB, HCVRNAPCRQN Hepatitis A Lab Results  Component Value Date   HAV REACTIVE (A) 12/04/2017   RPR and STI Lab Results  Component Value Date   LABRPR NON-REACTIVE 11/17/2019   LABRPR NON-REACTIVE 05/31/2019   LABRPR NON-REACTIVE 03/03/2019   LABRPR NON-REACTIVE 12/03/2018   LABRPR NON-REACTIVE 09/03/2018    STI Results GC CT  11/17/2019 Negative Negative  11/17/2019 Negative Negative  11/17/2019 Negative Negative  05/31/2019 Negative Negative  05/31/2019 Negative Negative  05/31/2019 Negative Negative  03/03/2019 Negative Negative  03/03/2019 Negative Negative  03/03/2019 Negative Negative  12/03/2018 Negative Negative  12/03/2018 Negative Negative  12/03/2018 Negative Negative  09/03/2018 Negative Negative  09/03/2018 Negative Negative  09/03/2018 Negative Negative  06/12/2018 Negative Negative  06/12/2018 Negative Negative   06/12/2018 Negative Negative  03/11/2018 Negative Negative  03/11/2018 Negative Negative    Assessment: [name] is here today to follow up for PrEP. He is doing well on Truvada and not having any issues or side effects.  He still remains with one partner and they use condoms. No issues today. We updated his STD screening at last visit so will just check HIV today. Will see him back in 3 months.  Plan: - HIV antibody today - Truvada x 3 months if HIV negative - F/u with me in 3 months  Jenesis Suchy L. Odette Watanabe, PharmD, BCIDP, AAHIVP, CPP Clinical Pharmacist Practitioner Infectious Diseases Clinical  Pharmacist [facility] 02/16/2020, 8:56 AM

## 2020-02-17 ENCOUNTER — Other Ambulatory Visit: Payer: Self-pay | Admitting: Pharmacist

## 2020-02-17 DIAGNOSIS — Z7252 High risk homosexual behavior: Secondary | ICD-10-CM

## 2020-02-17 LAB — HIV ANTIBODY (ROUTINE TESTING W REFLEX): HIV 1&2 Ab, 4th Generation: NONREACTIVE

## 2020-02-17 MED ORDER — EMTRICITABINE-TENOFOVIR DF 200-300 MG PO TABS: 1.0000 | ORAL_TABLET | Freq: Every day | ORAL | 0 refills | Status: DC

## 2020-02-17 NOTE — Progress Notes (Signed)
Patient's HIV antibody is negative.  Will [facility] in [facility] months of [facility]  

## 2020-05-02 ENCOUNTER — Other Ambulatory Visit: Payer: Self-pay | Admitting: Pharmacist

## 2020-05-02 DIAGNOSIS — Z7252 High risk homosexual behavior: Secondary | ICD-10-CM

## 2020-05-10 ENCOUNTER — Other Ambulatory Visit (HOSPITAL_COMMUNITY)
Admission: RE | Admit: 2020-05-10 | Discharge: 2020-05-10 | Disposition: A | Discharge status: Home / Self Care | Payer: BC Managed Care – PPO | Source: Ambulatory Visit | Attending: Infectious Disease | Admitting: Infectious Disease

## 2020-05-10 ENCOUNTER — Ambulatory Visit (INDEPENDENT_AMBULATORY_CARE_PROVIDER_SITE_OTHER): Payer: BC Managed Care – PPO | Admitting: Pharmacist

## 2020-05-10 ENCOUNTER — Other Ambulatory Visit: Payer: Self-pay

## 2020-05-10 DIAGNOSIS — Z7252 High risk homosexual behavior: Secondary | ICD-10-CM | POA: Diagnosis present

## 2020-05-10 DIAGNOSIS — Z23 Encounter for immunization: Secondary | ICD-10-CM

## 2020-05-10 NOTE — Progress Notes (Signed)
Date:  05/10/2020   HPI: Phillip Calhoun is a 49 y.o. male who presents to the [facility] pharmacy clinic for HIV PrEP follow-up.  Insured   [x]    Uninsured  []    Patient Active Problem List   Diagnosis Date Noted  . High risk sexual behavior 10/09/2016  . Hypertension 10/09/2016  . 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 Migraine syndrome 10/09/2016    Patient's Medications  New Prescriptions   No medications on file  Previous Medications   AMINO ACIDS (L-CARNITINE PO)    Take 1,000 mg by mouth 3 (three) times daily.   ASCORBIC ACID (VITAMIN C) 1000 MG TABLET    Take 1,000 mg by mouth 3 (three) times daily.   COENZYME Q10 (CO Q10) 100 MG CAPS    Take 100 mg by mouth daily.   EMTRICITABINE-TENOFOVIR (TRUVADA) 200-300 MG TABLET    Take 1 tablet by mouth daily.   GLUTAMINE PO    Take 1,000 mg by mouth daily.   LISINOPRIL-HYDROCHLOROTHIAZIDE (PRINZIDE,ZESTORETIC) 10-12.5 MG TABLET    Take 1 tablet by mouth daily.   MULTIPLE VITAMIN (MULTIVITAMIN WITH MINERALS) TABS TABLET    Take 1 tablet by mouth daily.   PROMETHAZINE (PHENERGAN) 25 MG TABLET    Take 25 mg by mouth as needed for nausea. 1 tablet every 6 hours as needed for nausea   SAFFLOWER OIL (CLA) 1000 MG CAPS    Take 1,000 mg by mouth 3 (three) times daily.   SUMATRIPTAN (IMITREX) 100 MG TABLET    Take 100 mg by mouth as needed.  Modified Medications   No medications on file  Discontinued Medications   No medications on file    Allergies: No Known Allergies  Past Medical History: No past medical history on file.  Social History: Social History   Socioeconomic History  . Marital status: Divorced    Spouse name: Not on file  . Number of children: Not on file  . Years of education: Not on file  . Highest education level: Not on file  Occupational History  . Not on file  Tobacco Use  . Smoking status: Not on file  Substance and Sexual Activity  . Alcohol use: Not on file  . Drug use: Not on file  .  Sexual activity: Not on file  Other Topics Concern  . Not on file  Social History Narrative  . Not on file   Social Determinants of Health   Financial Resource Strain:   . Difficulty of Paying Living Expenses: Not on file  Food Insecurity:   . Worried About 12/09/2016 in the Last Year: Not on file  . Ran Out of Food in the Last Year: Not on file  Transportation Needs:   . Lack of Transportation (Medical): Not on file  . Lack of Transportation (Non-Medical): Not on file  Physical Activity:   . Days of Exercise per Week: Not on file  . Minutes of Exercise per Session: Not on file  Stress:   . Feeling of Stress : Not on file  Social Connections:   . Frequency of Communication with Friends and Family: Not on file  . Frequency of Social Gatherings with Friends and Family: Not on file  . Attends Religious Services: Not on file  . Active Member of Clubs or Organizations: Not on file  . Attends 12/09/2016 Meetings: Not on file  . Marital Status: Not on file    CHL HIV PREP  FLOWSHEET RESULTS 03/04/2019 12/04/2018 09/03/2018 06/12/2018 03/11/2018 12/04/2017 09/10/2017  Insurance Status Insured Insured Insured Insured Insured Insured Insured  How did you hear? - - - - - Primary care referral Referral from primary  Gender at birth Male Male Male Male Male Male Male  Gender identity cis-Male cis-Male cis-Male cis-Male cis-Male cis-Male cis-Male  Risk for HIV - - - - - - -  Risk for HIV - >5 partners in past 6 mos (regardless of condom use) >5 partners in past 6 mos (regardless of condom use) - >5 partners in past 6 mos (regardless of condom use);Hx of STI;Condomless vaginal or anal intercourse In sexual relationship with HIV+ partner;Hx of STI In sexual relationship with HIV+ partner;Hx of STI  Sex Partners Men only Men only Men only - Men only Men only Men only  # of sex partners - - - - - - -  # sex partners past 3-6 mos 1-3 1-3 1-3 1-3 7-9 1-3 1-3  Sex activity preferences  Insertive and receptive;Oral Insertive and receptive;Oral Insertive and receptive;Oral Insertive and receptive;Oral Insertive and receptive;Oral Insertive and receptive;Oral Receptive;Oral  Condom use Yes Yes Yes Yes Yes Yes Yes  % condom use 100 100 100 99 100 100 100  Partners genders and ages - - - - - M 30-49;M 50+ M 50+  Treated for STI? No No No No No No Yes  HIV symptoms? N/A N/A N/A N/A N/A - N/A  PrEP Eligibility Substantial risk for HIV Substantial risk for HIV;HIV negative Substantial risk for HIV Substantial risk for HIV - HIV negative;CrCl >60 ml/min;Substantial risk for HIV HIV negative;Substantial risk for HIV  Paper work received? - - - - - No Yes    Labs:  SCr: Lab Results  Component Value Date   CREATININE 1.15 11/17/2019   CREATININE 1.06 05/31/2019   CREATININE 1.10 03/03/2019   CREATININE 1.20 09/03/2018   CREATININE 1.27 03/11/2018   HIV Lab Results  Component Value Date   HIV NON-REACTIVE 02/16/2020   HIV NON-REACTIVE 11/17/2019   HIV NON-REACTIVE 08/25/2019   HIV NON-REACTIVE 05/31/2019   HIV NON-REACTIVE 03/03/2019   Hepatitis B Lab Results  Component Value Date   HEPBSAB REACTIVE (A) 12/04/2017   HEPBSAG NEGATIVE 10/09/2016   Hepatitis C No results found for: HEPCAB, HCVRNAPCRQN Hepatitis A Lab Results  Component Value Date   HAV REACTIVE (A) 12/04/2017   RPR and STI Lab Results  Component Value Date   LABRPR NON-REACTIVE 11/17/2019   LABRPR NON-REACTIVE 05/31/2019   LABRPR NON-REACTIVE 03/03/2019   LABRPR NON-REACTIVE 12/03/2018   LABRPR NON-REACTIVE 09/03/2018    STI Results GC CT  11/17/2019 Negative Negative  11/17/2019 Negative Negative  11/17/2019 Negative Negative  05/31/2019 Negative Negative  05/31/2019 Negative Negative  05/31/2019 Negative Negative  03/03/2019 Negative Negative  03/03/2019 Negative Negative  03/03/2019 Negative Negative  12/03/2018 Negative Negative  12/03/2018 Negative Negative  12/03/2018 Negative Negative   09/03/2018 Negative Negative  09/03/2018 Negative Negative  09/03/2018 Negative Negative  06/12/2018 Negative Negative  06/12/2018 Negative Negative  06/12/2018 Negative Negative  03/11/2018 Negative Negative  03/11/2018 Negative Negative    Assessment: [name] presents today for his 52-month PrEP follow-up. He continues on Truvada without any issues. He remains with his one partner with 100% condom use. Will check HIV ab and refill Truvada if negative. Will also check for STIs today. Plan to follow-up in 3 months.  Patient has not received his flu shot yet this year, so administered that today. He asked about  the COVID booster (he received Pfizer) but said he was unable to schedule with [place] on Friday during clinic.    Plan: Check HIV ab Refill Truvada if HIV ab - Check urine/oral/rectal cytology (gonn/chlam) and RPR Administered flu shot today Follow-up with Cassie on 08/03/20 at 915AM  Margarite Gouge, PharmD PGY2 ID Pharmacy Resident [phone]  05/10/2020, 9:15 AM

## 2020-05-11 LAB — BASIC METABOLIC PANEL
BUN: 14 mg/dL (ref 7–25)
CO2: 27 mmol/L (ref 20–32)
Calcium: 10.2 mg/dL (ref 8.6–10.3)
Chloride: 99 mmol/L (ref 98–110)
Creat: 1.11 mg/dL (ref 0.60–1.35)
Glucose, Bld: 90 mg/dL (ref 65–99)
Potassium: 4.5 mmol/L (ref 3.5–5.3)
Sodium: 136 mmol/L (ref 135–146)

## 2020-05-11 LAB — CYTOLOGY, (ORAL, ANAL, URETHRAL) ANCILLARY ONLY
Chlamydia: NEGATIVE
Chlamydia: NEGATIVE
Comment: NEGATIVE
Comment: NEGATIVE
Comment: NORMAL
Comment: NORMAL
Neisseria Gonorrhea: NEGATIVE
Neisseria Gonorrhea: NEGATIVE

## 2020-05-11 LAB — RPR: RPR Ser Ql: NONREACTIVE

## 2020-05-11 LAB — HIV ANTIBODY (ROUTINE TESTING W REFLEX): HIV 1&2 Ab, 4th Generation: NONREACTIVE

## 2020-05-12 ENCOUNTER — Other Ambulatory Visit: Payer: Self-pay | Admitting: Pharmacist

## 2020-05-12 DIAGNOSIS — Z7252 High risk homosexual behavior: Secondary | ICD-10-CM

## 2020-05-12 MED ORDER — EMTRICITABINE-TENOFOVIR DF 200-300 MG PO TABS: 1.0000 | ORAL_TABLET | Freq: Every day | ORAL | 0 refills | Status: DC

## 2020-07-25 ENCOUNTER — Other Ambulatory Visit: Payer: Self-pay | Admitting: Pharmacist

## 2020-07-25 DIAGNOSIS — Z7252 High risk homosexual behavior: Secondary | ICD-10-CM

## 2020-07-31 ENCOUNTER — Other Ambulatory Visit: Payer: Self-pay | Admitting: Pharmacist

## 2020-07-31 DIAGNOSIS — Z79899 Other long term (current) drug therapy: Secondary | ICD-10-CM

## 2020-07-31 DIAGNOSIS — Z7252 High risk homosexual behavior: Secondary | ICD-10-CM

## 2020-07-31 MED ORDER — EMTRICITABINE-TENOFOVIR DF 200-300 MG PO TABS: 1.0000 | ORAL_TABLET | Freq: Every day | ORAL | 0 refills | Status: DC

## 2020-08-03 ENCOUNTER — Other Ambulatory Visit: Payer: Self-pay

## 2020-08-03 ENCOUNTER — Other Ambulatory Visit (HOSPITAL_COMMUNITY)
Admission: RE | Admit: 2020-08-03 | Discharge: 2020-08-03 | Disposition: A | Discharge status: Home / Self Care | Payer: BC Managed Care – PPO | Source: Ambulatory Visit | Attending: Family | Admitting: Family

## 2020-08-03 ENCOUNTER — Ambulatory Visit: Payer: Self-pay | Admitting: Pharmacist

## 2020-08-03 ENCOUNTER — Other Ambulatory Visit: Payer: BC Managed Care – PPO

## 2020-08-03 DIAGNOSIS — Z113 Encounter for screening for infections with a predominantly sexual mode of transmission: Secondary | ICD-10-CM | POA: Diagnosis present

## 2020-08-03 DIAGNOSIS — Z79899 Other long term (current) drug therapy: Secondary | ICD-10-CM

## 2020-08-04 LAB — CYTOLOGY, (ORAL, ANAL, URETHRAL) ANCILLARY ONLY
Chlamydia: NEGATIVE
Chlamydia: NEGATIVE
Comment: NEGATIVE
Comment: NEGATIVE
Comment: NORMAL
Comment: NORMAL
Neisseria Gonorrhea: NEGATIVE
Neisseria Gonorrhea: NEGATIVE

## 2020-08-04 LAB — URINE CYTOLOGY ANCILLARY ONLY
Chlamydia: NEGATIVE
Comment: NEGATIVE
Comment: NORMAL
Neisseria Gonorrhea: NEGATIVE

## 2020-08-04 LAB — HIV ANTIBODY (ROUTINE TESTING W REFLEX): HIV 1&2 Ab, 4th Generation: NONREACTIVE

## 2020-08-07 ENCOUNTER — Telehealth: Payer: Self-pay

## 2020-08-07 NOTE — Telephone Encounter (Signed)
Called patient to relay results per FNP. Patient does not have any questions regarding results. Would like office to send 90 day refill of generic [facility] to [facility].

## 2020-08-07 NOTE — Telephone Encounter (Signed)
Refill was sent on 1/24 by Aggie Cosier, RPH-CPP. Ok to refill if did not go through.   Marcos Eke, NP 08/07/2020 12:22 PM

## 2020-08-07 NOTE — Telephone Encounter (Signed)
-----   Message from Veryl Speak, FNP sent at 08/07/2020 10:19 AM EST ----- Please inform Mr. Poplaski that his STI testing and HIV antibody tests were all negative.

## 2020-10-11 ENCOUNTER — Other Ambulatory Visit: Payer: Self-pay | Admitting: Pharmacist

## 2020-10-11 DIAGNOSIS — Z7252 High risk homosexual behavior: Secondary | ICD-10-CM

## 2020-11-02 ENCOUNTER — Other Ambulatory Visit (HOSPITAL_COMMUNITY)
Admission: RE | Admit: 2020-11-02 | Discharge: 2020-11-02 | Disposition: A | Discharge status: Home / Self Care | Payer: BC Managed Care – PPO | Source: Ambulatory Visit | Attending: Family | Admitting: Family

## 2020-11-02 ENCOUNTER — Encounter: Payer: Self-pay | Admitting: Family

## 2020-11-02 ENCOUNTER — Ambulatory Visit (INDEPENDENT_AMBULATORY_CARE_PROVIDER_SITE_OTHER): Payer: BC Managed Care – PPO | Admitting: Family

## 2020-11-02 ENCOUNTER — Other Ambulatory Visit: Payer: Self-pay

## 2020-11-02 DIAGNOSIS — Z7252 High risk homosexual behavior: Secondary | ICD-10-CM

## 2020-11-02 MED ORDER — EMTRICITABINE-TENOFOVIR DF 200-300 MG PO TABS
1.0000 | ORAL_TABLET | Freq: Every day | ORAL | 0 refills | Status: DC
Start: 2020-11-02 — End: 2021-01-17

## 2020-11-02 NOTE — Progress Notes (Signed)
Subjective:    Patient ID: Phillip Calhoun, male    DOB: 1971/05/07, 50 y.o.   MRN: [id number]  Chief Complaint  Patient presents with  . PrEP     HPI:  Phillip Calhoun is a 50 y.o. male on preexposure prophylaxis for HIV last seen on 08/03/2020 with negative STD and HIV testing.  Here today for routine follow-up.  Mr. Phillip Calhoun has been taking his Truvada daily as prescribed with no adverse side effects or missed doses.  Has no problems obtaining medication from the pharmacy.  Overall feeling well today with no new concerns/complaints.  Would like to complete STI testing. Denies fevers, chills, night sweats, headaches, changes in vision, neck pain/stiffness, nausea, diarrhea, vomiting, lesions or rashes. Declines condoms.   Allergies  Allergen Reactions  . Emtricitabine-Tenofovir Af     Other reaction(s): rash      Outpatient Medications Prior to Visit  Medication Sig Dispense Refill  . Amino Acids (L-CARNITINE PO) Take 1,000 mg by mouth daily.    . Ascorbic Acid (VITAMIN C) 1000 MG tablet Take 1,000 mg by mouth daily.    . Coenzyme Q10 (CO Q10) 100 MG CAPS Take 100 mg by mouth daily.    Marland Kitchen lisinopril-hydrochlorothiazide (PRINZIDE,ZESTORETIC) 10-12.5 MG tablet Take 1 tablet by mouth daily.    . Multiple Vitamin (MULTIVITAMIN WITH MINERALS) TABS tablet Take 1 tablet by mouth daily.    . promethazine (PHENERGAN) 25 MG tablet Take 25 mg by mouth as needed for nausea. 1 tablet every 6 hours as needed for nausea    . Safflower Oil (CLA) 1000 MG CAPS Take 1,000 mg by mouth daily.    . SUMAtriptan (IMITREX) 100 MG tablet Take 100 mg by mouth as needed.    Marland Kitchen emtricitabine-tenofovir (TRUVADA) 200-300 MG tablet Take 1 tablet by mouth daily. 90 tablet 0  . GLUTAMINE PO Take 1,000 mg by mouth daily.     No facility-administered medications prior to visit.     History reviewed. No pertinent past medical history.   History reviewed. No pertinent surgical history.     Review of Systems   Constitutional: Negative for appetite change, chills, fatigue, fever and unexpected weight change.  Eyes: Negative for visual disturbance.  Respiratory: Negative for cough, chest tightness, shortness of breath and wheezing.   Cardiovascular: Negative for chest pain and leg swelling.  Gastrointestinal: Negative for abdominal pain, constipation, diarrhea, nausea and vomiting.  Genitourinary: Negative for dysuria, flank pain, frequency, genital sores, hematuria and urgency.  Skin: Negative for rash.  Allergic/Immunologic: Negative for immunocompromised state.  Neurological: Negative for dizziness and headaches.      Objective:    BP 120/80   Pulse 76   Temp 98.1 F (36.7 C) (Oral)   Ht 5\' 9"  (1.753 m)   Wt 212 lb (96.2 kg)   SpO2 99%   BMI 31.31 kg/m  Nursing note and vital signs reviewed.  Physical Exam Constitutional:      General: He is not in acute distress.    Appearance: He is well-developed.  Eyes:     Conjunctiva/sclera: Conjunctivae normal.  Cardiovascular:     Rate and Rhythm: Normal rate and regular rhythm.     Heart sounds: Normal heart sounds. No murmur heard. No friction rub. No gallop.   Pulmonary:     Effort: Pulmonary effort is normal. No respiratory distress.     Breath sounds: Normal breath sounds. No wheezing or rales.  Chest:     Chest wall: No tenderness.  Abdominal:     General: Bowel sounds are normal.     Palpations: Abdomen is soft.     Tenderness: There is no abdominal tenderness.  Musculoskeletal:     Cervical back: Neck supple.  Lymphadenopathy:     Cervical: No cervical adenopathy.  Skin:    General: Skin is warm and dry.     Findings: No rash.  Neurological:     Mental Status: He is alert and oriented to person, place, and time.  Psychiatric:        Behavior: Behavior normal.        Thought Content: Thought content normal.        Judgment: Judgment normal.      Depression screen PHQ 2/9 11/02/2020  Decreased Interest 0  Down,  Depressed, Hopeless 0  PHQ - 2 Score 0       Assessment & Plan:    Patient Active Problem List   Diagnosis Date Noted  . High risk sexual behavior 10/09/2016  . Hypertension 10/09/2016  . 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 Migraine syndrome 10/09/2016     Problem List Items Addressed This Visit      Other   High risk sexual behavior    Mr. Kutz continues to do well with his preexposure prophylaxis for HIV with Truvada.  Check HIV, BMP and STDs.  Discussed importance of safe sexual practice to reduce risk of STI.  Condoms declined.  Continue current dose of Truvada.      Relevant Medications   emtricitabine-tenofovir (TRUVADA) 200-300 MG tablet   Other Relevant Orders   Basic metabolic panel   HIV antibody (with reflex)   RPR   Cytology (oral, anal, urethral) ancillary only   Cytology (oral, anal, urethral) ancillary only   Urine cytology ancillary only([facility])       I have discontinued Chirag Strupp's GLUTAMINE PO. I am also having him maintain his lisinopril-hydrochlorothiazide, promethazine, SUMAtriptan, multivitamin with minerals, Amino Acids (L-CARNITINE PO), Co Q10, vitamin C, CLA, and emtricitabine-tenofovir.   Meds ordered this encounter  Medications  . emtricitabine-tenofovir (TRUVADA) 200-300 MG tablet    Sig: Take 1 tablet by mouth daily.    Dispense:  90 tablet    Refill:  0    Generic only please;    Order Specific Question:   Supervising Provider    Answer:   Judyann Munson [[id number]]     Follow-up: Return in about 3 months (around 02/01/2021).   Marcos Eke, MSN, FNP-C Nurse Practitioner [facility] Main number: [phone]

## 2020-11-02 NOTE — Patient Instructions (Signed)
Nice to meet you.  We will check your lab work today.    Continue to take your Truvada - refill have been sent to the pharmacy.  Plan for follow up in 3 months or sooner if needed.   Have a great day and stay safe!

## 2020-11-02 NOTE — Assessment & Plan Note (Signed)
Phillip Calhoun continues to do well with his preexposure prophylaxis for HIV with Truvada.  Check HIV, BMP and STDs.  Discussed importance of safe sexual practice to reduce risk of STI.  Condoms declined.  Continue current dose of Truvada.

## 2020-11-03 LAB — BASIC METABOLIC PANEL
BUN: 11 mg/dL (ref 7–25)
CO2: 31 mmol/L (ref 20–32)
Calcium: 9.8 mg/dL (ref 8.6–10.3)
Chloride: 101 mmol/L (ref 98–110)
Creat: 1 mg/dL (ref 0.60–1.35)
Glucose, Bld: 73 mg/dL (ref 65–99)
Potassium: 3.7 mmol/L (ref 3.5–5.3)
Sodium: 140 mmol/L (ref 135–146)

## 2020-11-03 LAB — CYTOLOGY, (ORAL, ANAL, URETHRAL) ANCILLARY ONLY
Chlamydia: NEGATIVE
Chlamydia: NEGATIVE
Comment: NEGATIVE
Comment: NEGATIVE
Comment: NORMAL
Comment: NORMAL
Neisseria Gonorrhea: NEGATIVE
Neisseria Gonorrhea: NEGATIVE

## 2020-11-03 LAB — URINE CYTOLOGY ANCILLARY ONLY
Chlamydia: NEGATIVE
Comment: NEGATIVE
Comment: NORMAL
Neisseria Gonorrhea: NEGATIVE

## 2020-11-03 LAB — HIV ANTIBODY (ROUTINE TESTING W REFLEX): HIV 1&2 Ab, 4th Generation: NONREACTIVE

## 2020-11-03 LAB — RPR: RPR Ser Ql: NONREACTIVE

## 2020-11-03 NOTE — Progress Notes (Signed)
Advised and informed him that Rx was sent in to [facility].

## 2021-01-17 ENCOUNTER — Other Ambulatory Visit: Payer: Self-pay | Admitting: Family

## 2021-01-17 DIAGNOSIS — Z7252 High risk homosexual behavior: Secondary | ICD-10-CM

## 2021-01-17 NOTE — Telephone Encounter (Signed)
Please advise on refill. Has appt  8/18 for follow up.

## 2021-01-17 NOTE — Telephone Encounter (Signed)
Ok for 30 days then needs follow up.

## 2021-02-12 ENCOUNTER — Telehealth: Payer: Self-pay

## 2021-02-12 NOTE — Telephone Encounter (Signed)
Patient called wanting to get monkeypox vaccine. Advised him we do not currently have them here and to reach out to the health department to determine if he's eligible. Patient verbalized understanding and has no further questions.   Sandie Ano, RN

## 2021-02-22 ENCOUNTER — Encounter: Payer: Self-pay | Admitting: Pharmacist

## 2021-02-22 ENCOUNTER — Other Ambulatory Visit: Payer: Self-pay

## 2021-02-22 ENCOUNTER — Ambulatory Visit: Payer: BC Managed Care – PPO | Admitting: Family

## 2021-02-22 ENCOUNTER — Ambulatory Visit (INDEPENDENT_AMBULATORY_CARE_PROVIDER_SITE_OTHER): Payer: BC Managed Care – PPO | Admitting: Pharmacist

## 2021-02-22 ENCOUNTER — Other Ambulatory Visit (HOSPITAL_COMMUNITY)
Admission: RE | Admit: 2021-02-22 | Discharge: 2021-02-22 | Disposition: A | Discharge status: Home / Self Care | Payer: BC Managed Care – PPO | Source: Ambulatory Visit | Attending: Family | Admitting: Family

## 2021-02-22 DIAGNOSIS — Z79899 Other long term (current) drug therapy: Secondary | ICD-10-CM | POA: Diagnosis not present

## 2021-02-22 DIAGNOSIS — Z7252 High risk homosexual behavior: Secondary | ICD-10-CM | POA: Diagnosis not present

## 2021-02-22 DIAGNOSIS — Z113 Encounter for screening for infections with a predominantly sexual mode of transmission: Secondary | ICD-10-CM | POA: Insufficient documentation

## 2021-02-22 DIAGNOSIS — Z23 Encounter for immunization: Secondary | ICD-10-CM | POA: Diagnosis not present

## 2021-02-22 NOTE — Progress Notes (Addendum)
Date:  02/22/2021   HPI: Phillip Calhoun is a 50 y.o. male who presents to the [facility] pharmacy clinic for HIV PrEP follow-up.  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   ASCORBIC ACID (VITAMIN C) 1000 MG TABLET    Take 1,000 mg by mouth daily.   COENZYME Q10 (CO Q10) 100 MG CAPS    Take 100 mg by mouth daily.   EMTRICITABINE-TENOFOVIR (TRUVADA) 200-300 MG TABLET    TAKE 1 TABLET BY MOUTH 1 TIME A DAY.   LISINOPRIL-HYDROCHLOROTHIAZIDE (PRINZIDE,ZESTORETIC) 10-12.5 MG TABLET    Take 1 tablet by mouth daily.   MULTIPLE VITAMIN (MULTIVITAMIN WITH MINERALS) TABS TABLET    Take 1 tablet by mouth daily.   PROMETHAZINE (PHENERGAN) 25 MG TABLET    Take 25 mg by mouth as needed for nausea. 1 tablet every 6 hours as needed for nausea   SAFFLOWER OIL (CLA) 1000 MG CAPS    Take 1,000 mg by mouth daily.   SUMATRIPTAN (IMITREX) 100 MG TABLET    Take 100 mg by mouth as needed.  Modified Medications   No medications on file  Discontinued Medications   No medications on file    Allergies: Allergies  Allergen Reactions   Emtricitabine-Tenofovir Af     Other reaction(s): rash    Past Medical History: No past medical history on file.  Social History: Social History   Socioeconomic History   Marital status: Divorced    Spouse name: Not on file   Number of children: Not on file   Years of education: Not on file   Highest education level: Not on file  Occupational History   Not on file  Tobacco Use   Smoking status: Never   Smokeless tobacco: Never  Substance and Sexual Activity   Alcohol use: Never   Drug use: Never   Sexual activity: Yes     Partners: Male    Birth control/protection: Condom    Comment: offered condoms  Other Topics Concern   Not on file  Social History Narrative   Not on file   Social Determinants of Health   Financial Resource Strain: Not on file  Food Insecurity: Not on file  Transportation Needs: Not on file  Physical Activity: Not on file  Stress: Not on file  Social Connections: Not on file    [facility] HIV PREP FLOWSHEET RESULTS 03/04/2019 12/04/2018 09/03/2018 06/12/2018 03/11/2018 12/04/2017 09/10/2017  Insurance Status Insured Insured Insured Insured Insured Insured Insured  How did you hear? - - - - - Primary care referral Referral from primary  Gender at birth Male Male Male Male Male Male Male  Gender identity cis-Male cis-Male cis-Male cis-Male cis-Male cis-Male cis-Male  Risk for HIV - - - - - - -  Risk for HIV - >5 partners in past 6 mos (regardless of condom use) >5 partners in past 6 mos (regardless of condom use) - >5 partners in past 6 mos (regardless of condom use);Hx of STI;Condomless vaginal or anal intercourse In sexual relationship with HIV+ partner;Hx of STI In sexual relationship with HIV+ partner;Hx of STI  Sex Partners Men only Men only Men only - Men only Men only Men only  # of sex partners - - - - - - -  # sex partners past 3-6 mos 1-3 1-3 1-3 1-3 7-9 1-3 1-3  Sex activity preferences Insertive and receptive;Oral Insertive and receptive;Oral Insertive and receptive;Oral Insertive and receptive;Oral Insertive and receptive;Oral Insertive and receptive;Oral Receptive;Oral  Condom use Yes Yes Yes Yes Yes Yes Yes  % condom use 100 100 100 99 100 100 100  Partners genders and ages - - - - - M 30-49;M 50+ M 50+  Treated for STI? No No No No No No Yes  HIV symptoms? N/A N/A N/A N/A N/A - N/A  PrEP Eligibility Substantial risk for HIV Substantial risk for HIV;HIV negative Substantial risk for HIV Substantial risk for HIV - HIV negative;CrCl >60 ml/min;Substantial risk for HIV HIV  negative;Substantial risk for HIV  Paper work received? - - - - - No Yes    Labs:  SCr: Lab Results  Component Value Date   CREATININE 1.00 11/02/2020   CREATININE 1.11 05/10/2020   CREATININE 1.15 11/17/2019   CREATININE 1.06 05/31/2019   CREATININE 1.10 03/03/2019   HIV Lab Results  Component Value Date   HIV NON-REACTIVE 11/02/2020   HIV NON-REACTIVE 08/03/2020   HIV NON-REACTIVE 05/10/2020   HIV NON-REACTIVE 02/16/2020   HIV NON-REACTIVE 11/17/2019   Hepatitis B Lab Results  Component Value Date   HEPBSAB REACTIVE (A) 12/04/2017   HEPBSAG NEGATIVE 10/09/2016   Hepatitis C No results found for: HEPCAB, HCVRNAPCRQN Hepatitis A Lab Results  Component Value Date   HAV REACTIVE (A) 12/04/2017   RPR and STI Lab Results  Component Value Date   LABRPR NON-REACTIVE 11/02/2020   LABRPR NON-REACTIVE 05/10/2020   LABRPR NON-REACTIVE 11/17/2019   LABRPR NON-REACTIVE 05/31/2019   LABRPR NON-REACTIVE 03/03/2019    STI Results GC CT  11/02/2020 Negative Negative  11/02/2020 Negative Negative  11/02/2020 Negative Negative  08/03/2020 Negative Negative  08/03/2020 Negative Negative  08/03/2020 Negative Negative  05/10/2020 Negative Negative  05/10/2020 Negative Negative  11/17/2019 Negative Negative  11/17/2019 Negative Negative  11/17/2019 Negative Negative  05/31/2019 Negative Negative  05/31/2019 Negative Negative  05/31/2019 Negative Negative  03/03/2019 Negative Negative  03/03/2019 Negative Negative  03/03/2019 Negative Negative  12/03/2018 Negative Negative  12/03/2018 Negative Negative  12/03/2018 Negative Negative    Assessment: Manual is here today for his 3 month follow up for PrEP. He continues to take Truvada every day with no issues or concerns. He is no longer in a long-term relationship with anyone and has had new partners since he was last screened. Denies condom use with the new partner. Screened for acute HIV symptoms such as fatigue, muscle aches, rash,  sore throat, lymphadenopathy, headache, night sweats, nausea/vomiting/diarrhea, and fever. Denies any symptoms. Will screen for HIV and all STIs today.   He would like to receive the MPX vaccine, Jynneos, so administered dose #1 of 2 today. He tolerated well and will return in 28 days for his 2nd dose.   Plan: - HIV antibody, RPR, urine/rectal/pharyngeal GC/CT swabs for cytology today - Truvada x 90 days if HIV negative - MPX vaccine #1/2  - F/u on 9/15 for MPX vaccine #2 - F/u on 11/17 for PrEP  Sutton Hirsch L. Ipek Westra, PharmD, BCIDP, AAHIVP, CPP Clinical Pharmacist Practitioner Infectious Diseases Clinical Pharmacist [facility] 02/22/2021, 11:32 AM

## 2021-02-22 NOTE — Progress Notes (Signed)
     [facility]  Name:  Filmore Molyneux    MRN: [id number] DOB: 26-Sep-1970  JYNNEOS vaccine 0.67mL intradermal  NDC: [address]-54 Lot: [id number] Expiration date: 02/22/2021 Site of administration: right forearm   02/22/2021  Mr. Tozzi was observed post JYNNEOS immunization for 15 minutes without incident. He was provided with Vaccine Information Sheet and instruction to access the V-Safe system.   Mr. Laura was instructed to call 911 with any severe reactions post vaccine: Difficulty breathing  Swelling of face and throat  A fast heartbeat  A bad rash all over body  Dizziness and weakness   Labrisha Wuellner L. Shalita Notte, PharmD, BCIDP, AAHIVP, CPP Clinical Pharmacist Practitioner Infectious Diseases Clinical Pharmacist [facility] 02/22/2021, 3:01 PM

## 2021-02-23 ENCOUNTER — Other Ambulatory Visit: Payer: Self-pay | Admitting: Pharmacist

## 2021-02-23 DIAGNOSIS — Z7252 High risk homosexual behavior: Secondary | ICD-10-CM

## 2021-02-23 LAB — CYTOLOGY, (ORAL, ANAL, URETHRAL) ANCILLARY ONLY
Chlamydia: NEGATIVE
Chlamydia: NEGATIVE
Comment: NEGATIVE
Comment: NEGATIVE
Comment: NORMAL
Comment: NORMAL
Neisseria Gonorrhea: NEGATIVE
Neisseria Gonorrhea: NEGATIVE

## 2021-02-23 LAB — URINE CYTOLOGY ANCILLARY ONLY
Chlamydia: NEGATIVE
Comment: NEGATIVE
Comment: NORMAL
Neisseria Gonorrhea: NEGATIVE

## 2021-02-23 LAB — RPR: RPR Ser Ql: NONREACTIVE

## 2021-02-23 LAB — HIV ANTIBODY (ROUTINE TESTING W REFLEX): HIV 1&2 Ab, 4th Generation: NONREACTIVE

## 2021-02-23 MED ORDER — EMTRICITABINE-TENOFOVIR DF 200-300 MG PO TABS: 1.0000 | ORAL_TABLET | Freq: Every day | ORAL | 0 refills | Status: DC

## 2021-02-23 NOTE — Addendum Note (Signed)
Addended by: Robinette Haines on: 02/23/2021 12:08 PM   Modules accepted: Orders

## 2021-02-23 NOTE — Progress Notes (Signed)
Patient's HIV antibody is negative.  Will send in 3 more months of Descovy to [facility].

## 2021-03-22 ENCOUNTER — Ambulatory Visit (INDEPENDENT_AMBULATORY_CARE_PROVIDER_SITE_OTHER): Payer: BC Managed Care – PPO | Admitting: Pharmacist

## 2021-03-22 ENCOUNTER — Other Ambulatory Visit: Payer: Self-pay

## 2021-03-22 DIAGNOSIS — Z23 Encounter for immunization: Secondary | ICD-10-CM | POA: Diagnosis not present

## 2021-03-22 DIAGNOSIS — Z79899 Other long term (current) drug therapy: Secondary | ICD-10-CM

## 2021-03-22 NOTE — Progress Notes (Signed)
   03/22/2021  HPI: Phillip Calhoun is a 50 y.o. male who presents to the [facility] pharmacy clinic for monkeypox immunization.   Phillip Calhoun was observed post immunization for 15 minutes without incident. He was provided with Vaccine Information Sheet and instruction to access the V-Safe system.   Phillip Calhoun was instructed to call 911 with any severe reactions post vaccine: Difficulty breathing  Swelling of face and throat  A fast heartbeat  A bad rash all over body  Dizziness and weakness   Also administered flu shot today.  Saranda Legrande L. Ariyan Sinnett, PharmD, BCIDP, AAHIVP, CPP Clinical Pharmacist Practitioner Infectious Diseases Clinical Pharmacist [facility] 03/22/2021, 2:03 PM

## 2021-05-14 ENCOUNTER — Other Ambulatory Visit: Payer: Self-pay | Admitting: Pharmacist

## 2021-05-14 DIAGNOSIS — Z7252 High risk homosexual behavior: Secondary | ICD-10-CM

## 2021-05-24 ENCOUNTER — Other Ambulatory Visit (HOSPITAL_COMMUNITY)
Admission: RE | Admit: 2021-05-24 | Discharge: 2021-05-24 | Disposition: A | Discharge status: Home / Self Care | Payer: BC Managed Care – PPO | Source: Ambulatory Visit | Attending: Infectious Disease | Admitting: Infectious Disease

## 2021-05-24 ENCOUNTER — Other Ambulatory Visit: Payer: Self-pay

## 2021-05-24 ENCOUNTER — Telehealth: Payer: Self-pay

## 2021-05-24 ENCOUNTER — Other Ambulatory Visit (HOSPITAL_COMMUNITY): Payer: Self-pay

## 2021-05-24 ENCOUNTER — Ambulatory Visit (INDEPENDENT_AMBULATORY_CARE_PROVIDER_SITE_OTHER): Payer: BC Managed Care – PPO | Admitting: Pharmacist

## 2021-05-24 DIAGNOSIS — Z79899 Other long term (current) drug therapy: Secondary | ICD-10-CM

## 2021-05-24 NOTE — Telephone Encounter (Signed)
[facility] Patient Advocate Encounter  Apretude will need a PA.  Clearance Coots, CPhT Specialty Pharmacy Patient [facility] Phone: [phone] Fax:  [phone]

## 2021-05-24 NOTE — Progress Notes (Signed)
Date:  05/24/2021   HPI: Phillip Calhoun is a 50 y.o. male who presents to the [facility] pharmacy clinic for HIV PrEP follow-up.  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   ASCORBIC ACID (VITAMIN C) 1000 MG TABLET    Take 1,000 mg by mouth daily.   COENZYME Q10 (CO Q10) 100 MG CAPS    Take 100 mg by mouth daily.   EMTRICITABINE-TENOFOVIR (TRUVADA) 200-300 MG TABLET    Take 1 tablet by mouth daily.   LISINOPRIL-HYDROCHLOROTHIAZIDE (PRINZIDE,ZESTORETIC) 10-12.5 MG TABLET    Take 1 tablet by mouth daily.   MULTIPLE VITAMIN (MULTIVITAMIN WITH MINERALS) TABS TABLET    Take 1 tablet by mouth daily.   PROMETHAZINE (PHENERGAN) 25 MG TABLET    Take 25 mg by mouth as needed for nausea. 1 tablet every 6 hours as needed for nausea   SAFFLOWER OIL (CLA) 1000 MG CAPS    Take 1,000 mg by mouth daily.   SUMATRIPTAN (IMITREX) 100 MG TABLET    Take 100 mg by mouth as needed.  Modified Medications   No medications on file  Discontinued Medications   No medications on file    Allergies: Allergies  Allergen Reactions   Emtricitabine-Tenofovir Af     Other reaction(s): rash    Past Medical History: No past medical history on file.  Social History: Social History   Socioeconomic History   Marital status: Divorced    Spouse name: Not on file   Number of children: Not on file   Years of education: Not on file   Highest education level: Not on file  Occupational History   Not on file  Tobacco Use   Smoking status: Never   Smokeless tobacco: Never  Substance and Sexual Activity   Alcohol use: Never   Drug use: Never   Sexual activity: Yes     Partners: Male    Birth control/protection: Condom    Comment: offered condoms  Other Topics Concern   Not on file  Social History Narrative   Not on file   Social Determinants of Health   Financial Resource Strain: Not on file  Food Insecurity: Not on file  Transportation Needs: Not on file  Physical Activity: Not on file  Stress: Not on file  Social Connections: Not on file    [facility] HIV PREP FLOWSHEET RESULTS 03/04/2019 12/04/2018 09/03/2018 06/12/2018 03/11/2018 12/04/2017 09/10/2017  Insurance Status Insured Insured Insured Insured Insured Insured Insured  How did you hear? - - - - - Primary care referral Referral from primary  Gender at birth Male Male Male Male Male Male Male  Gender identity cis-Male cis-Male cis-Male cis-Male cis-Male cis-Male cis-Male  Risk for HIV - - - - - - -  Risk for HIV - >5 partners in past 6 mos (regardless of condom use) >5 partners in past 6 mos (regardless of condom use) - >5 partners in past 6 mos (regardless of condom use);Hx of STI;Condomless vaginal or anal intercourse In sexual relationship with HIV+ partner;Hx of STI In sexual relationship with HIV+ partner;Hx of STI  Sex Partners  Men only Men only Men only - Men only Men only Men only  # of sex partners - - - - - - -  # sex partners past 3-6 mos 1-3 1-3 1-3 1-3 7-9 1-3 1-3  Sex activity preferences Insertive and receptive;Oral Insertive and receptive;Oral Insertive and receptive;Oral Insertive and receptive;Oral Insertive and receptive;Oral Insertive and receptive;Oral Receptive;Oral  Condom use Yes Yes Yes Yes Yes Yes Yes  % condom use 100 100 100 99 100 100 100  Partners genders and ages - - - - - M 30-49;M 50+ M 50+  Treated for STI? No No No No No No Yes  HIV symptoms? N/A N/A N/A N/A N/A - N/A  PrEP Eligibility Substantial risk for HIV Substantial risk for HIV;HIV negative Substantial risk for HIV Substantial risk for HIV - HIV negative;CrCl >60 ml/min;Substantial risk for HIV HIV  negative;Substantial risk for HIV  Paper work received? - - - - - No Yes    Labs:  SCr: Lab Results  Component Value Date   CREATININE 1.00 11/02/2020   CREATININE 1.11 05/10/2020   CREATININE 1.15 11/17/2019   CREATININE 1.06 05/31/2019   CREATININE 1.10 03/03/2019   HIV Lab Results  Component Value Date   HIV NON-REACTIVE 02/22/2021   HIV NON-REACTIVE 11/02/2020   HIV NON-REACTIVE 08/03/2020   HIV NON-REACTIVE 05/10/2020   HIV NON-REACTIVE 02/16/2020   Hepatitis B Lab Results  Component Value Date   HEPBSAB REACTIVE (A) 12/04/2017   HEPBSAG NEGATIVE 10/09/2016   Hepatitis C No results found for: HEPCAB, HCVRNAPCRQN Hepatitis A Lab Results  Component Value Date   HAV REACTIVE (A) 12/04/2017   RPR and STI Lab Results  Component Value Date   LABRPR NON-REACTIVE 02/22/2021   LABRPR NON-REACTIVE 11/02/2020   LABRPR NON-REACTIVE 05/10/2020   LABRPR NON-REACTIVE 11/17/2019   LABRPR NON-REACTIVE 05/31/2019    STI Results GC CT  02/22/2021 Negative Negative  02/22/2021 Negative Negative  02/22/2021 Negative Negative  11/02/2020 Negative Negative  11/02/2020 Negative Negative  11/02/2020 Negative Negative  08/03/2020 Negative Negative  08/03/2020 Negative Negative  08/03/2020 Negative Negative  05/10/2020 Negative Negative  05/10/2020 Negative Negative  11/17/2019 Negative Negative  11/17/2019 Negative Negative  11/17/2019 Negative Negative  05/31/2019 Negative Negative  05/31/2019 Negative Negative  05/31/2019 Negative Negative  03/03/2019 Negative Negative  03/03/2019 Negative Negative  03/03/2019 Negative Negative    Assessment: Phillip Calhoun presents to clinic today for HIV PrEP follow-up. He takes Truvada daily as prescribed. Over the last 3 months he has missed 1 or 2 doses. He receives his refills via mail and reports no issues with this as long as a 90-day supply is sent in. He does not have any acute HIV symptoms today. We discussed Apretude injections as an option for  PrEP, but he does not mind to stay on on his Truvada as it is easiest for his busy schedule. He does have a new partner and would like to get STI screening today. We will check a urine, rectal, and oral cytology today. He feels well overall and no questions or concerns at today's visit. We will check HIV antibody today and send in Truada x 3 months if negative.   Plan: Check HIV antibody, Urine/Rectal/Oral Cytologies Follow-up on 08/22/21 @ 8:45 Truvada x3 months if HIV antibody negative  Shirlee More, PharmD PGY2 Infectious Diseases Pharmacy Resident

## 2021-05-25 ENCOUNTER — Other Ambulatory Visit: Payer: Self-pay

## 2021-05-25 DIAGNOSIS — Z7252 High risk homosexual behavior: Secondary | ICD-10-CM

## 2021-05-25 LAB — URINE CYTOLOGY ANCILLARY ONLY
Chlamydia: NEGATIVE
Comment: NEGATIVE
Comment: NORMAL
Neisseria Gonorrhea: NEGATIVE

## 2021-05-25 LAB — CYTOLOGY, (ORAL, ANAL, URETHRAL) ANCILLARY ONLY
Chlamydia: NEGATIVE
Chlamydia: NEGATIVE
Comment: NEGATIVE
Comment: NEGATIVE
Comment: NORMAL
Comment: NORMAL
Neisseria Gonorrhea: NEGATIVE
Neisseria Gonorrhea: NEGATIVE

## 2021-05-25 LAB — HIV ANTIBODY (ROUTINE TESTING W REFLEX): HIV 1&2 Ab, 4th Generation: NONREACTIVE

## 2021-05-25 MED ORDER — EMTRICITABINE-TENOFOVIR DF 200-300 MG PO TABS: 1.0000 | ORAL_TABLET | Freq: Every day | ORAL | 0 refills | Status: DC

## 2021-05-25 NOTE — Progress Notes (Addendum)
HIV antibody returned negative. Will [facility].   Shirlee More, PharmD PGY2 Infectious Diseases Pharmacy Resident

## 2021-05-30 ENCOUNTER — Other Ambulatory Visit (HOSPITAL_COMMUNITY): Payer: Self-pay

## 2021-08-07 ENCOUNTER — Other Ambulatory Visit: Payer: Self-pay | Admitting: Pharmacist

## 2021-08-07 DIAGNOSIS — Z7252 High risk homosexual behavior: Secondary | ICD-10-CM

## 2021-08-22 ENCOUNTER — Ambulatory Visit (INDEPENDENT_AMBULATORY_CARE_PROVIDER_SITE_OTHER): Payer: BC Managed Care – PPO | Admitting: Pharmacist

## 2021-08-22 ENCOUNTER — Other Ambulatory Visit: Payer: Self-pay

## 2021-08-22 DIAGNOSIS — Z79899 Other long term (current) drug therapy: Secondary | ICD-10-CM

## 2021-08-22 NOTE — Progress Notes (Signed)
Date:  08/22/2021   HPI: Phillip Calhoun is a 51 y.o. male who presents to the [facility] clinic for HIV PrEP follow-up.  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   ASCORBIC ACID (VITAMIN C) 1000 MG TABLET    Take 1,000 mg by mouth daily.   COENZYME Q10 (CO Q10) 100 MG CAPS    Take 100 mg by mouth daily.   EMTRICITABINE-TENOFOVIR (TRUVADA) 200-300 MG TABLET    Take 1 tablet by mouth daily.   LISINOPRIL-HYDROCHLOROTHIAZIDE (PRINZIDE,ZESTORETIC) 10-12.5 MG TABLET    Take 1 tablet by mouth daily.   MULTIPLE VITAMIN (MULTIVITAMIN WITH MINERALS) TABS TABLET    Take 1 tablet by mouth daily.   PROMETHAZINE (PHENERGAN) 25 MG TABLET    Take 25 mg by mouth as needed for nausea. 1 tablet every 6 hours as needed for nausea   SAFFLOWER OIL (CLA) 1000 MG CAPS    Take 1,000 mg by mouth daily.   SUMATRIPTAN (IMITREX) 100 MG TABLET    Take 100 mg by mouth as needed.  Modified Medications   No medications on file  Discontinued Medications   No medications on file    Allergies: Allergies  Allergen Reactions   Emtricitabine-Tenofovir Af     Other reaction(s): rash    Past Medical History: No past medical history on file.  Social History: Social History   Socioeconomic History   Marital status: Divorced    Spouse name: Not on file   Number of children: Not on file   Years of education: Not on file   Highest education level: Not on file  Occupational History   Not on file  Tobacco Use   Smoking status: Never   Smokeless tobacco: Never  Substance and Sexual Activity   Alcohol use: Never   Drug use: Never   Sexual activity: Yes     Partners: Male    Birth control/protection: Condom    Comment: offered condoms  Other Topics Concern   Not on file  Social History Narrative   Not on file   Social Determinants of Health   Financial Resource Strain: Not on file  Food Insecurity: Not on file  Transportation Needs: Not on file  Physical Activity: Not on file  Stress: Not on file  Social Connections: Not on file    [facility] HIV PREP FLOWSHEET RESULTS 03/04/2019 12/04/2018 09/03/2018 06/12/2018 03/11/2018 12/04/2017 09/10/2017  Insurance Status Insured Insured Insured Insured Insured Insured Insured  How did you hear? - - - - - Primary care referral Referral from primary  Gender at birth Male Male Male Male Male Male Male  Gender identity cis-Male cis-Male cis-Male cis-Male cis-Male cis-Male cis-Male  Risk for HIV - - - - - - -  Risk for HIV - >5 partners in past 6 mos (regardless of condom use) >5 partners in past 6 mos (regardless of condom use) - >5 partners in past 6 mos (regardless of condom use);Hx of STI;Condomless vaginal or anal intercourse In sexual relationship with HIV+ partner;Hx of STI In sexual relationship with HIV+ partner;Hx of STI  Sex Partners  Men only Men only Men only - Men only Men only Men only  # of sex partners - - - - - - -  # sex partners past 3-6 mos 1-3 1-3 1-3 1-3 7-9 1-3 1-3  Sex activity preferences Insertive and receptive;Oral Insertive and receptive;Oral Insertive and receptive;Oral Insertive and receptive;Oral Insertive and receptive;Oral Insertive and receptive;Oral Receptive;Oral  Condom use Yes Yes Yes Yes Yes Yes Yes  % condom use 100 100 100 99 100 100 100  Partners genders and ages - - - - - M 30-49;M 50+ M 50+  Treated for STI? No No No No No No Yes  HIV symptoms? N/A N/A N/A N/A N/A - N/A  PrEP Eligibility Substantial risk for HIV Substantial risk for HIV;HIV negative Substantial risk for HIV Substantial risk for HIV - HIV negative;CrCl >60 ml/min;Substantial risk for HIV HIV  negative;Substantial risk for HIV  Paper work received? - - - - - No Yes    Labs:  SCr: Lab Results  Component Value Date   CREATININE 1.00 11/02/2020   CREATININE 1.11 05/10/2020   CREATININE 1.15 11/17/2019   CREATININE 1.06 05/31/2019   CREATININE 1.10 03/03/2019   HIV Lab Results  Component Value Date   HIV NON-REACTIVE 05/24/2021   HIV NON-REACTIVE 02/22/2021   HIV NON-REACTIVE 11/02/2020   HIV NON-REACTIVE 08/03/2020   HIV NON-REACTIVE 05/10/2020   Hepatitis B Lab Results  Component Value Date   HEPBSAB REACTIVE (A) 12/04/2017   HEPBSAG NEGATIVE 10/09/2016   Hepatitis C No results found for: HEPCAB, HCVRNAPCRQN Hepatitis A Lab Results  Component Value Date   HAV REACTIVE (A) 12/04/2017   RPR and STI Lab Results  Component Value Date   LABRPR NON-REACTIVE 02/22/2021   LABRPR NON-REACTIVE 11/02/2020   LABRPR NON-REACTIVE 05/10/2020   LABRPR NON-REACTIVE 11/17/2019   LABRPR NON-REACTIVE 05/31/2019    STI Results GC CT  05/24/2021 Negative Negative  05/24/2021 Negative Negative  05/24/2021 Negative Negative  02/22/2021 Negative Negative  02/22/2021 Negative Negative  02/22/2021 Negative Negative  11/02/2020 Negative Negative  11/02/2020 Negative Negative  11/02/2020 Negative Negative  08/03/2020 Negative Negative  08/03/2020 Negative Negative  08/03/2020 Negative Negative  05/10/2020 Negative Negative  05/10/2020 Negative Negative  11/17/2019 Negative Negative  11/17/2019 Negative Negative  11/17/2019 Negative Negative  05/31/2019 Negative Negative  05/31/2019 Negative Negative  05/31/2019 Negative Negative    Assessment: Phillip Calhoun is here today for his 3 month HIV PrEP follow up appointment. He is doing well on Truvada and having no issues or concerns. STIs were checked at the last visit and were negative. He has had no new partners or encounters since he was here last. He asked for more information regarding Apretude, so we spent time discussing the schedule  of injections, what to expect, side effects, insurance coverage, and the clinical trials. Advised that he would need to be seen every 2 months instead of every 3. Discussed that the appointments would be a tad longer since he has to get a rapid HIV test beforehand with the lab. He will think about transitioning to the injection possibly this summer once school is out and he is more flexible with his time. No other questions or concerns. Last kidney check was in April 2022. He qualifies for q6 month monitoring now that he is > 54 years old, so will recheck that today in addition to a HIV antibody. He prefers a phone call with his results and a 90 day supply sent in for his Truvada. Will see  him back in 3 months.   Plan: - HIV antibody + BMP today - Truvada x 90 days if HIV negative  - F/u with me again on 5/3 at 845am  Micalah Cabezas L. Eber Hong, PharmD, BCIDP, AAHIVP, CPP Clinical Pharmacist Practitioner Infectious Diseases [facility] 08/22/2021, 9:00 AM

## 2021-08-22 NOTE — Patient Instructions (Signed)
PrEP injection - Apretude

## 2021-08-23 ENCOUNTER — Other Ambulatory Visit: Payer: Self-pay | Admitting: Pharmacist

## 2021-08-23 DIAGNOSIS — Z7252 High risk homosexual behavior: Secondary | ICD-10-CM

## 2021-08-23 LAB — BASIC METABOLIC PANEL
BUN: 12 mg/dL (ref 7–25)
CO2: 30 mmol/L (ref 20–32)
Calcium: 9.5 mg/dL (ref 8.6–10.3)
Chloride: 101 mmol/L (ref 98–110)
Creat: 1.09 mg/dL (ref 0.70–1.30)
Glucose, Bld: 95 mg/dL (ref 65–99)
Potassium: 4.2 mmol/L (ref 3.5–5.3)
Sodium: 137 mmol/L (ref 135–146)

## 2021-08-23 LAB — HIV ANTIBODY (ROUTINE TESTING W REFLEX): HIV 1&2 Ab, 4th Generation: NONREACTIVE

## 2021-08-23 MED ORDER — EMTRICITABINE-TENOFOVIR DF 200-300 MG PO TABS: 1.0000 | ORAL_TABLET | Freq: Every day | ORAL | 0 refills | Status: DC

## 2021-11-01 ENCOUNTER — Other Ambulatory Visit: Payer: Self-pay | Admitting: Pharmacist

## 2021-11-01 DIAGNOSIS — Z7252 High risk homosexual behavior: Secondary | ICD-10-CM

## 2021-11-07 ENCOUNTER — Ambulatory Visit (INDEPENDENT_AMBULATORY_CARE_PROVIDER_SITE_OTHER): Payer: BC Managed Care – PPO | Admitting: Pharmacist

## 2021-11-07 ENCOUNTER — Other Ambulatory Visit: Payer: Self-pay

## 2021-11-07 DIAGNOSIS — Z79899 Other long term (current) drug therapy: Secondary | ICD-10-CM | POA: Diagnosis not present

## 2021-11-07 NOTE — Progress Notes (Signed)
? ?Date:  11/07/2021  ? ?HPI: Carnelius Hammitt is a 51 y.o. male who presents to the [facility] pharmacy clinic for HIV PrEP follow-up. ? ?Insured   [x]    Uninsured  []   ? ?Patient Active Problem List  ? Diagnosis Date Noted  ? High risk sexual behavior 10/09/2016  ? Hypertension 10/09/2016  ? 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 Migraine syndrome 10/09/2016  ? ? ?Patient's Medications  ?New Prescriptions  ? No medications on file  ?Previous Medications  ? AMINO ACIDS (L-CARNITINE PO)    Take 1,000 mg by mouth daily.  ? ASCORBIC ACID (VITAMIN C) 1000 MG TABLET    Take 1,000 mg by mouth daily.  ? COENZYME Q10 (CO Q10) 100 MG CAPS    Take 100 mg by mouth daily.  ? EMTRICITABINE-TENOFOVIR (TRUVADA) 200-300 MG TABLET    Take 1 tablet by mouth daily.  ? LISINOPRIL-HYDROCHLOROTHIAZIDE (PRINZIDE,ZESTORETIC) 10-12.5 MG TABLET    Take 1 tablet by mouth daily.  ? MULTIPLE VITAMIN (MULTIVITAMIN WITH MINERALS) TABS TABLET    Take 1 tablet by mouth daily.  ? PROMETHAZINE (PHENERGAN) 25 MG TABLET    Take 25 mg by mouth as needed for nausea. 1 tablet every 6 hours as needed for nausea  ? SAFFLOWER OIL (CLA) 1000 MG CAPS    Take 1,000 mg by mouth daily.  ? SUMATRIPTAN (IMITREX) 100 MG TABLET    Take 100 mg by mouth as needed.  ?Modified Medications  ? No medications on file  ?Discontinued Medications  ? No medications on file  ? ? ?Allergies: ?Allergies  ?Allergen Reactions  ? Emtricitabine-Tenofovir Af   ?  Other reaction(s): rash  ? ? ?Past Medical History: ?No past medical history on file. ? ?Social History: ?Social History  ? ?Socioeconomic History  ? Marital status: Divorced  ?  Spouse name: Not on file  ? Number of children: Not on file  ? Years of education: Not on file  ? Highest education level: Not on file  ?Occupational History  ? Not on file  ?Tobacco Use  ? Smoking status: Never  ? Smokeless tobacco: Never  ?Substance and Sexual Activity  ? Alcohol use: Never  ? Drug use: Never  ? Sexual activity: Yes  ?   Partners: Male  ?  Birth control/protection: Condom  ?  Comment: offered condoms  ?Other Topics Concern  ? Not on file  ?Social History Narrative  ? Not on file  ? ?Social Determinants of Health  ? ?Financial Resource Strain: Not on file  ?Food Insecurity: Not on file  ?Transportation Needs: Not on file  ?Physical Activity: Not on file  ?Stress: Not on file  ?Social Connections: Not on file  ? ? ? ?  03/04/2019  ? 12:15 PM 12/04/2018  ? 11:34 AM 09/03/2018  ? 11:38 AM 06/12/2018  ?  8:59 AM 03/11/2018  ? 12:07 PM 03/11/2018  ?  9:05 AM 12/04/2017  ?  9:10 AM  ?[facility] HIV PREP FLOWSHEET RESULTS  ?Insurance Status Insured Insured Insured Insured  Insured Insured  ?How did you hear?       Primary care referral  ?Gender at birth Male Male Male Male  Male Male  ?Gender identity cis-Male cis-Male cis-Male cis-Male  cis-Male cis-Male  ?Risk for HIV  >5 partners in past 6 mos (regardless of condom use) >5 partners in past 6 mos (regardless of condom use)  >5 partners in past 6 mos (regardless of condom use);Hx of STI;Condomless vaginal  or anal intercourse  In sexual relationship with HIV+ partner;Hx of STI  ?Sex Partners Men only Men only Men only  Men only  Men only  ?# sex partners past 3-6 mos 1-3 1-3 1-3 1-3  7-9 1-3  ?Sex activity preferences Insertive and receptive;Oral Insertive and receptive;Oral Insertive and receptive;Oral Insertive and receptive;Oral  Insertive and receptive;Oral Insertive and receptive;Oral  ?Condom use Yes Yes Yes Yes  Yes Yes  ?% condom use 100 100 100 99  100 100  ?Partners genders and ages       M 30-49;M 50+  ?Treated for STI? No No No No No  No  ?HIV symptoms? N/A N/A N/A N/A  N/A   ?PrEP Eligibility Substantial risk for HIV Substantial risk for HIV;HIV negative Substantial risk for HIV Substantial risk for HIV   HIV negative;CrCl >60 ml/min;Substantial risk for HIV  ?Paper work received?       No  ? ? ?Labs: ? ?SCr: ?Lab Results  ?Component Value Date  ? CREATININE 1.09 08/22/2021  ? CREATININE 1.00  11/02/2020  ? CREATININE 1.11 05/10/2020  ? CREATININE 1.15 11/17/2019  ? CREATININE 1.06 05/31/2019  ? ?HIV ?Lab Results  ?Component Value Date  ? HIV NON-REACTIVE 08/22/2021  ? HIV NON-REACTIVE 05/24/2021  ? HIV NON-REACTIVE 02/22/2021  ? HIV NON-REACTIVE 11/02/2020  ? HIV NON-REACTIVE 08/03/2020  ? ?Hepatitis B ?Lab Results  ?Component Value Date  ? HEPBSAB REACTIVE (A) 12/04/2017  ? HEPBSAG NEGATIVE 10/09/2016  ? ?Hepatitis C ?No results found for: HEPCAB, HCVRNAPCRQN ?Hepatitis A ?Lab Results  ?Component Value Date  ? HAV REACTIVE (A) 12/04/2017  ? ?RPR and STI ?Lab Results  ?Component Value Date  ? LABRPR NON-REACTIVE 02/22/2021  ? LABRPR NON-REACTIVE 11/02/2020  ? LABRPR NON-REACTIVE 05/10/2020  ? LABRPR NON-REACTIVE 11/17/2019  ? LABRPR NON-REACTIVE 05/31/2019  ? ? ?STI Results GC CT  ?05/24/2021 ? 9:17 AM Negative    ? Negative    ? Negative   Negative    ? Negative    ? Negative    ?02/22/2021 ?11:36 AM Negative    ? Negative    ? Negative   Negative    ? Negative    ? Negative    ?11/02/2020 ? 9:08 AM Negative    ? Negative    ? Negative   Negative    ? Negative    ? Negative    ?08/03/2020 ? 9:54 AM Negative    ? Negative    ? Negative   Negative    ? Negative    ? Negative    ?05/10/2020 ? 9:18 AM Negative    ? Negative   Negative    ? Negative    ?11/17/2019 ? 9:25 AM Negative    ? Negative    ? Negative   Negative    ? Negative    ? Negative    ?05/31/2019 ? 9:23 AM Negative    ? Negative    ? Negative   Negative    ? Negative    ? Negative    ?03/03/2019 ?12:00 AM Negative    ? Negative    ? Negative   Negative    ? Negative    ? Negative    ?12/03/2018 ?12:00 AM Negative    ? Negative    ? Negative   Negative    ? Negative    ? Negative    ?09/03/2018 ?12:00 AM Negative    ? Negative    ?  Negative   Negative    ? Negative    ? Negative    ?06/12/2018 ?12:00 AM Negative    ? Negative    ? Negative   Negative    ? Negative    ? Negative    ?03/11/2018 ?12:00 AM Negative    ? Negative    ? Negative   Negative     ? Negative    ? Negative    ?12/04/2017 ?12:00 AM Negative    ? Negative    ? Negative   Negative    ? Negative    ? Negative    ?09/10/2017 ?12:00 AM Negative   Negative    ?06/26/2017 ?12:00 AM Negative    ? Negative    ? Negative   Negative    ? Negative    ? Negative    ?03/26/2017 ?12:00 AM Negative    ? Negative    ? Negative   Negative    ? Negative    ? Negative    ?01/14/2017 ?12:00 AM Negative    ? Negative    ? Negative   Negative    ? Negative    ? Negative    ?10/09/2016 ?12:00 AM Negative    ? Negative    ? Negative   Negative    ? Negative    ? Negative    ? ? ?Assessment: ?Thereasa DistanceRodney is here today for his 3 month PrEP follow up. He is doing well on Truvada without issues or side effects. No problems with [facility]. No new partners and defers STI testing today. [place]. Will just get HIV testing today and see him back in 3 months for follow up.  ? ?Plan: ?- HIV antibody today ?- Truvada x 90 days if HIV negative ?- F/u with me on 7/25 at 845am  ? ?Maddelyn Rocca L. Mckoy Bhakta, PharmD, BCIDP, AAHIVP, CPP ?Clinical Pharmacist Practitioner ?Infectious Diseases Clinical Pharmacist ?[facility] ?11/07/2021, 9:10 AM ? ?

## 2021-11-08 ENCOUNTER — Other Ambulatory Visit: Payer: Self-pay | Admitting: Pharmacist

## 2021-11-08 DIAGNOSIS — Z7252 High risk homosexual behavior: Secondary | ICD-10-CM

## 2021-11-08 LAB — HIV ANTIBODY (ROUTINE TESTING W REFLEX): HIV 1&2 Ab, 4th Generation: NONREACTIVE

## 2021-11-08 MED ORDER — EMTRICITABINE-TENOFOVIR DF 200-300 MG PO TABS: 1.0000 | ORAL_TABLET | Freq: Every day | ORAL | 0 refills | Status: DC

## 2022-01-29 ENCOUNTER — Ambulatory Visit: Payer: BC Managed Care – PPO | Admitting: Pharmacist

## 2022-01-30 ENCOUNTER — Other Ambulatory Visit: Payer: Self-pay

## 2022-01-30 ENCOUNTER — Ambulatory Visit (INDEPENDENT_AMBULATORY_CARE_PROVIDER_SITE_OTHER): Payer: BC Managed Care – PPO | Admitting: Pharmacist

## 2022-01-30 DIAGNOSIS — Z79899 Other long term (current) drug therapy: Secondary | ICD-10-CM | POA: Diagnosis not present

## 2022-01-30 NOTE — Progress Notes (Signed)
Date:  01/30/2022   HPI: Phillip Calhoun is a 51 y.o. male who presents to the [facility] pharmacy clinic for HIV PrEP follow-up.  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   ASCORBIC ACID (VITAMIN C) 1000 MG TABLET    Take 1,000 mg by mouth daily.   COENZYME Q10 (CO Q10) 100 MG CAPS    Take 100 mg by mouth daily.   EMTRICITABINE-TENOFOVIR (TRUVADA) 200-300 MG TABLET    Take 1 tablet by mouth daily.   LISINOPRIL-HYDROCHLOROTHIAZIDE (PRINZIDE,ZESTORETIC) 10-12.5 MG TABLET    Take 1 tablet by mouth daily.   MULTIPLE VITAMIN (MULTIVITAMIN WITH MINERALS) TABS TABLET    Take 1 tablet by mouth daily.   PROMETHAZINE (PHENERGAN) 25 MG TABLET    Take 25 mg by mouth as needed for nausea. 1 tablet every 6 hours as needed for nausea   SAFFLOWER OIL (CLA) 1000 MG CAPS    Take 1,000 mg by mouth daily.   SUMATRIPTAN (IMITREX) 100 MG TABLET    Take 100 mg by mouth as needed.  Modified Medications   No medications on file  Discontinued Medications   No medications on file    Allergies: Allergies  Allergen Reactions   Emtricitabine-Tenofovir Af     Other reaction(s): rash    Past Medical History: No past medical history on file.  Social History: Social History   Socioeconomic History   Marital status: Divorced    Spouse name: Not on file   Number of children: Not on file   Years of education: Not on file   Highest education level: Not on file  Occupational History   Not on file  Tobacco Use   Smoking status: Never   Smokeless tobacco: Never  Substance and Sexual Activity   Alcohol use: Never   Drug use: Never   Sexual activity: Yes     Partners: Male    Birth control/protection: Condom    Comment: offered condoms  Other Topics Concern   Not on file  Social History Narrative   Not on file   Social Determinants of Health   Financial Resource Strain: Not on file  Food Insecurity: Not on file  Transportation Needs: Not on file  Physical Activity: Not on file  Stress: Not on file  Social Connections: Not on file       03/04/2019   12:15 PM 12/04/2018   11:34 AM 09/03/2018   11:38 AM 06/12/2018    8:59 AM 03/11/2018   12:07 PM 03/11/2018    9:05 AM 12/04/2017    9:10 AM  [facility] HIV PREP FLOWSHEET RESULTS  Insurance Status Insured Insured Insured Insured  Insured Insured  How did you hear?       Primary care referral  Gender at birth Male Male Male Male  Male Male  Gender identity cis-Male cis-Male cis-Male cis-Male  cis-Male cis-Male  Risk for HIV  >5 partners in past 6 mos (regardless of condom use) >5 partners in past 6 mos (regardless of condom use)  >5 partners in past 6 mos (regardless of condom use);Hx of STI;Condomless vaginal  or anal intercourse  In sexual relationship with HIV+ partner;Hx of STI  Sex Partners Men only Men only Men only  Men only  Men only  # sex partners past 3-6 mos 1-3 1-3 1-3 1-3  7-9 1-3  Sex activity preferences Insertive and receptive;Oral Insertive and receptive;Oral Insertive and receptive;Oral Insertive and receptive;Oral  Insertive and receptive;Oral Insertive and receptive;Oral  Condom use Yes Yes Yes Yes  Yes Yes  % condom use 100 100 100 99  100 100  Partners genders and ages       M 30-49;M 50+  Treated for STI? No No No No No  No  HIV symptoms? N/A N/A N/A N/A  N/A   PrEP Eligibility Substantial risk for HIV Substantial risk for HIV;HIV negative Substantial risk for HIV Substantial risk for HIV   HIV negative;CrCl >60 ml/min;Substantial risk for HIV  Paper work received?       No    Labs:  SCr: Lab Results  Component Value Date   CREATININE 1.09 08/22/2021   CREATININE 1.00  11/02/2020   CREATININE 1.11 05/10/2020   CREATININE 1.15 11/17/2019   CREATININE 1.06 05/31/2019   HIV Lab Results  Component Value Date   HIV NON-REACTIVE 11/07/2021   HIV NON-REACTIVE 08/22/2021   HIV NON-REACTIVE 05/24/2021   HIV NON-REACTIVE 02/22/2021   HIV NON-REACTIVE 11/02/2020   Hepatitis B Lab Results  Component Value Date   HEPBSAB REACTIVE (A) 12/04/2017   HEPBSAG NEGATIVE 10/09/2016   Hepatitis C No results found for: "HEPCAB", "HCVRNAPCRQN" Hepatitis A Lab Results  Component Value Date   HAV REACTIVE (A) 12/04/2017   RPR and STI Lab Results  Component Value Date   LABRPR NON-REACTIVE 02/22/2021   LABRPR NON-REACTIVE 11/02/2020   LABRPR NON-REACTIVE 05/10/2020   LABRPR NON-REACTIVE 11/17/2019   LABRPR NON-REACTIVE 05/31/2019    STI Results GC CT  05/24/2021  9:17 AM Negative    Negative    Negative  Negative    Negative    Negative   02/22/2021 11:36 AM Negative    Negative    Negative  Negative    Negative    Negative   11/02/2020  9:08 AM Negative    Negative    Negative  Negative    Negative    Negative   08/03/2020  9:54 AM Negative    Negative    Negative  Negative    Negative    Negative   05/10/2020  9:18 AM Negative    Negative  Negative    Negative   11/17/2019  9:25 AM Negative    Negative    Negative  Negative    Negative    Negative   05/31/2019  9:23 AM Negative    Negative    Negative  Negative    Negative    Negative   03/03/2019 12:00 AM Negative    Negative    Negative  Negative    Negative    Negative   12/03/2018 12:00 AM Negative    Negative    Negative  Negative    Negative    Negative   09/03/2018 12:00 AM Negative    Negative    Negative  Negative    Negative    Negative   06/12/2018 12:00 AM Negative    Negative    Negative  Negative    Negative    Negative   03/11/2018 12:00 AM Negative    Negative    Negative  Negative  Negative    Negative   12/04/2017 12:00 AM Negative     Negative    Negative  Negative    Negative    Negative   09/10/2017 12:00 AM Negative  Negative   06/26/2017 12:00 AM Negative    Negative    Negative  Negative    Negative    Negative   03/26/2017 12:00 AM Negative    Negative    Negative  Negative    Negative    Negative   01/14/2017 12:00 AM Negative    Negative    Negative  Negative    Negative    Negative   10/09/2016 12:00 AM Negative    Negative    Negative  Negative    Negative    Negative     Assessment: Geffrey is here today for his 3 month PrEP follow up appointment. He is doing well on Truvada and having no issues or concerns. No problems with [facility] and has a few pills left currently. He recently saw his PCP and had his kidney, liver, and cholesterol checked. He also has had both Shingrix vaccines and is up-to-date on his COVID boosters. He politely decline STI testing today but would like a full panel at next visit in October. Screened for acute HIV symptoms such as fatigue, muscle aches, rash, sore throat, lymphadenopathy, headache, night sweats, nausea/vomiting/diarrhea, and fever. Denies any symptoms. No issues or concerns today. Will check a HIV test and refill his Truvada for a 90 day supply if negative.   Plan: - HIV antibody today - Truvada x 90 days if HIV negative - F/u with me on 10/25 at 845am  Sofya Moustafa L. Laren Orama, PharmD, BCIDP, AAHIVP, CPP Clinical Pharmacist Practitioner Infectious Diseases Clinical Pharmacist [facility] 01/30/2022, 8:41 AM

## 2022-01-31 ENCOUNTER — Other Ambulatory Visit: Payer: Self-pay | Admitting: Pharmacist

## 2022-01-31 DIAGNOSIS — Z7252 High risk homosexual behavior: Secondary | ICD-10-CM

## 2022-01-31 LAB — HIV ANTIBODY (ROUTINE TESTING W REFLEX): HIV 1&2 Ab, 4th Generation: NONREACTIVE

## 2022-01-31 MED ORDER — EMTRICITABINE-TENOFOVIR DF 200-300 MG PO TABS: 1.0000 | ORAL_TABLET | Freq: Every day | ORAL | 0 refills | Status: DC

## 2022-04-15 ENCOUNTER — Other Ambulatory Visit: Payer: Self-pay | Admitting: Pharmacist

## 2022-04-15 DIAGNOSIS — Z7252 High risk homosexual behavior: Secondary | ICD-10-CM

## 2022-05-01 ENCOUNTER — Other Ambulatory Visit: Payer: Self-pay

## 2022-05-01 ENCOUNTER — Ambulatory Visit (INDEPENDENT_AMBULATORY_CARE_PROVIDER_SITE_OTHER): Payer: BC Managed Care – PPO | Admitting: Pharmacist

## 2022-05-01 DIAGNOSIS — Z113 Encounter for screening for infections with a predominantly sexual mode of transmission: Secondary | ICD-10-CM

## 2022-05-01 DIAGNOSIS — Z79899 Other long term (current) drug therapy: Secondary | ICD-10-CM

## 2022-05-01 NOTE — Progress Notes (Signed)
Date:  05/01/2022   HPI: Phillip Calhoun is a 51 y.o. male who presents to the [facility] clinic for HIV PrEP follow-up.  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   ASCORBIC ACID (VITAMIN C) 1000 MG TABLET    Take 1,000 mg by mouth daily.   COENZYME Q10 (CO Q10) 100 MG CAPS    Take 100 mg by mouth daily.   EMTRICITABINE-TENOFOVIR (TRUVADA) 200-300 MG TABLET    Take 1 tablet by mouth daily.   LISINOPRIL-HYDROCHLOROTHIAZIDE (PRINZIDE,ZESTORETIC) 10-12.5 MG TABLET    Take 1 tablet by mouth daily.   MULTIPLE VITAMIN (MULTIVITAMIN WITH MINERALS) TABS TABLET    Take 1 tablet by mouth daily.   PROMETHAZINE (PHENERGAN) 25 MG TABLET    Take 25 mg by mouth as needed for nausea. 1 tablet every 6 hours as needed for nausea   SAFFLOWER OIL (CLA) 1000 MG CAPS    Take 1,000 mg by mouth daily.   SUMATRIPTAN (IMITREX) 100 MG TABLET    Take 100 mg by mouth as needed.  Modified Medications   No medications on file  Discontinued Medications   No medications on file    Allergies: Allergies  Allergen Reactions   Emtricitabine-Tenofovir Af     Other reaction(s): rash    Past Medical History: No past medical history on file.  Social History: Social History   Socioeconomic History   Marital status: Divorced    Spouse name: Not on file   Number of children: Not on file   Years of education: Not on file   Highest education level: Not on file  Occupational History   Not on file  Tobacco Use   Smoking status: Never   Smokeless tobacco: Never  Substance and Sexual Activity   Alcohol use: Never   Drug use: Never   Sexual activity: Yes     Partners: Male    Birth control/protection: Condom    Comment: offered condoms  Other Topics Concern   Not on file  Social History Narrative   Not on file   Social Determinants of Health   Financial Resource Strain: Not on file  Food Insecurity: Not on file  Transportation Needs: Not on file  Physical Activity: Not on file  Stress: Not on file  Social Connections: Not on file       03/04/2019   12:15 PM 12/04/2018   11:34 AM 09/03/2018   11:38 AM 06/12/2018    8:59 AM 03/11/2018   12:07 PM 03/11/2018    9:05 AM 12/04/2017    9:10 AM  [facility] HIV PREP FLOWSHEET RESULTS  Insurance Status Insured Insured Insured Insured  Insured Insured  How did you hear?       Primary care referral  Gender at birth Male Male Male Male  Male Male  Gender identity cis-Male cis-Male cis-Male cis-Male  cis-Male cis-Male  Risk for HIV  >5 partners in past 6 mos (regardless of condom use) >5 partners in past 6 mos (regardless of condom use)  >5 partners in past 6 mos (regardless of condom use);Hx of STI;Condomless vaginal  or anal intercourse  In sexual relationship with HIV+ partner;Hx of STI  Sex Partners Men only Men only Men only  Men only  Men only  # sex partners past 3-6 mos 1-3 1-3 1-3 1-3  7-9 1-3  Sex activity preferences Insertive and receptive;Oral Insertive and receptive;Oral Insertive and receptive;Oral Insertive and receptive;Oral  Insertive and receptive;Oral Insertive and receptive;Oral  Condom use Yes Yes Yes Yes  Yes Yes  % condom use 100 100 100 99  100 100  Partners genders and ages       M 30-49;M 50+  Treated for STI? No No No No No  No  HIV symptoms? N/A N/A N/A N/A  N/A   PrEP Eligibility Substantial risk for HIV Substantial risk for HIV;HIV negative Substantial risk for HIV Substantial risk for HIV   HIV negative;CrCl >60 ml/min;Substantial risk for HIV  Paper work received?       No    Labs:  SCr: Lab Results  Component Value Date   CREATININE 1.09 08/22/2021   CREATININE 1.00  11/02/2020   CREATININE 1.11 05/10/2020   CREATININE 1.15 11/17/2019   CREATININE 1.06 05/31/2019   HIV Lab Results  Component Value Date   HIV NON-REACTIVE 01/30/2022   HIV NON-REACTIVE 11/07/2021   HIV NON-REACTIVE 08/22/2021   HIV NON-REACTIVE 05/24/2021   HIV NON-REACTIVE 02/22/2021   Hepatitis B Lab Results  Component Value Date   HEPBSAB REACTIVE (A) 12/04/2017   HEPBSAG NEGATIVE 10/09/2016   Hepatitis C No results found for: "HEPCAB", "HCVRNAPCRQN" Hepatitis A Lab Results  Component Value Date   HAV REACTIVE (A) 12/04/2017   RPR and STI Lab Results  Component Value Date   LABRPR NON-REACTIVE 02/22/2021   LABRPR NON-REACTIVE 11/02/2020   LABRPR NON-REACTIVE 05/10/2020   LABRPR NON-REACTIVE 11/17/2019   LABRPR NON-REACTIVE 05/31/2019    STI Results GC CT  05/24/2021  9:17 AM Negative    Negative    Negative  Negative    Negative    Negative   02/22/2021 11:36 AM Negative    Negative    Negative  Negative    Negative    Negative   11/02/2020  9:08 AM Negative    Negative    Negative  Negative    Negative    Negative   08/03/2020  9:54 AM Negative    Negative    Negative  Negative    Negative    Negative   05/10/2020  9:18 AM Negative    Negative  Negative    Negative   11/17/2019  9:25 AM Negative    Negative    Negative  Negative    Negative    Negative   05/31/2019  9:23 AM Negative    Negative    Negative  Negative    Negative    Negative   03/03/2019 12:00 AM Negative    Negative    Negative  Negative    Negative    Negative   12/03/2018 12:00 AM Negative    Negative    Negative  Negative    Negative    Negative   09/03/2018 12:00 AM Negative    Negative    Negative  Negative    Negative    Negative   06/12/2018 12:00 AM Negative    Negative    Negative  Negative    Negative    Negative   03/11/2018 12:00 AM Negative    Negative    Negative  Negative  Negative    Negative   12/04/2017 12:00 AM Negative     Negative    Negative  Negative    Negative    Negative   09/10/2017 12:00 AM Negative  Negative   06/26/2017 12:00 AM Negative    Negative    Negative  Negative    Negative    Negative   03/26/2017 12:00 AM Negative    Negative    Negative  Negative    Negative    Negative   01/14/2017 12:00 AM Negative    Negative    Negative  Negative    Negative    Negative   10/09/2016 12:00 AM Negative    Negative    Negative  Negative    Negative    Negative     Assessment: Phillip Calhoun presents today for his 3 month PrEP follow up. He shares that he is in good health and has no new prescription medications. He is tolerating Truvada well without issues. He has not missed any doses. He has about two weeks of tablets left. He has no new partners or concerns for STIs and politely declines STI screening today. Screened for acute HIV symptoms such as fatigue, muscle aches, rash, sore throat, lymphadenopathy, headache, night sweats, nausea/vomiting/diarrhea and fever. Denies any symptoms. Will check a HIV test and refill his Truvada for a 90 day supply if negative. He is going out of town this week and would prefer to defer his annual flu vaccine and updated COVID-19 vaccines.   Plan: - Check HIV antibody  - Send Truvada x 90 days if HIV negative - Scheduled 3 month f/u with Cassie on 07/31/2022  Larena Sox, PharmD PGY1 Pharmacy Resident   05/01/2022  8:54 AM

## 2022-05-02 ENCOUNTER — Other Ambulatory Visit: Payer: Self-pay

## 2022-05-02 DIAGNOSIS — Z7252 High risk homosexual behavior: Secondary | ICD-10-CM

## 2022-05-02 LAB — HIV ANTIBODY (ROUTINE TESTING W REFLEX): HIV 1&2 Ab, 4th Generation: NONREACTIVE

## 2022-05-02 MED ORDER — EMTRICITABINE-TENOFOVIR DF 200-300 MG PO TABS: 1.0000 | ORAL_TABLET | Freq: Every day | ORAL | 0 refills | Status: DC

## 2022-07-17 ENCOUNTER — Other Ambulatory Visit: Payer: Self-pay | Admitting: Pharmacist

## 2022-07-17 DIAGNOSIS — Z7252 High risk homosexual behavior: Secondary | ICD-10-CM

## 2022-07-30 ENCOUNTER — Other Ambulatory Visit: Payer: Self-pay | Admitting: Pharmacist

## 2022-07-30 DIAGNOSIS — Z7252 High risk homosexual behavior: Secondary | ICD-10-CM

## 2022-07-31 ENCOUNTER — Ambulatory Visit (INDEPENDENT_AMBULATORY_CARE_PROVIDER_SITE_OTHER): Payer: BC Managed Care – PPO | Admitting: Pharmacist

## 2022-07-31 ENCOUNTER — Other Ambulatory Visit: Payer: Self-pay

## 2022-07-31 ENCOUNTER — Other Ambulatory Visit (HOSPITAL_COMMUNITY)
Admission: RE | Admit: 2022-07-31 | Discharge: 2022-07-31 | Disposition: A | Discharge status: Home / Self Care | Payer: BC Managed Care – PPO | Source: Ambulatory Visit | Attending: Infectious Disease | Admitting: Infectious Disease

## 2022-07-31 ENCOUNTER — Other Ambulatory Visit (HOSPITAL_COMMUNITY): Payer: Self-pay

## 2022-07-31 DIAGNOSIS — Z2981 Encounter for HIV pre-exposure prophylaxis: Secondary | ICD-10-CM | POA: Diagnosis not present

## 2022-07-31 DIAGNOSIS — Z113 Encounter for screening for infections with a predominantly sexual mode of transmission: Secondary | ICD-10-CM | POA: Insufficient documentation

## 2022-07-31 DIAGNOSIS — Z79899 Other long term (current) drug therapy: Secondary | ICD-10-CM | POA: Insufficient documentation

## 2022-07-31 NOTE — Patient Instructions (Signed)
[facility]

## 2022-07-31 NOTE — Progress Notes (Signed)
Date:  07/31/2022   HPI: Phillip Calhoun is a 52 y.o. male who presents to the [facility] for HIV PrEP follow-up.  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   ASCORBIC ACID (VITAMIN C) 1000 MG TABLET    Take 1,000 mg by mouth daily.   COENZYME Q10 (CO Q10) 100 MG CAPS    Take 100 mg by mouth daily.   EMTRICITABINE-TENOFOVIR (TRUVADA) 200-300 MG TABLET    Take 1 tablet by mouth daily.   LISINOPRIL-HYDROCHLOROTHIAZIDE (PRINZIDE,ZESTORETIC) 10-12.5 MG TABLET    Take 1 tablet by mouth daily.   MULTIPLE VITAMIN (MULTIVITAMIN WITH MINERALS) TABS TABLET    Take 1 tablet by mouth daily.   PROMETHAZINE (PHENERGAN) 25 MG TABLET    Take 25 mg by mouth as needed for nausea. 1 tablet every 6 hours as needed for nausea   SAFFLOWER OIL (CLA) 1000 MG CAPS    Take 1,000 mg by mouth daily.   SUMATRIPTAN (IMITREX) 100 MG TABLET    Take 100 mg by mouth as needed.  Modified Medications   No medications on file  Discontinued Medications   No medications on file    Allergies: Allergies  Allergen Reactions   Emtricitabine-Tenofovir Af     Other reaction(s): rash    Past Medical History: No past medical history on file.  Social History: Social History   Socioeconomic History   Marital status: Divorced    Spouse name: Not on file   Number of children: Not on file   Years of education: Not on file   Highest education level: Not on file  Occupational History   Not on file  Tobacco Use   Smoking status: Never   Smokeless tobacco: Never  Substance and Sexual Activity   Alcohol use: Never   Drug use: Never   Sexual activity: Yes     Partners: Male    Birth control/protection: Condom    Comment: offered condoms  Other Topics Concern   Not on file  Social History Narrative   Not on file   Social Determinants of Health   Financial Resource Strain: Not on file  Food Insecurity: Not on file  Transportation Needs: Not on file  Physical Activity: Not on file  Stress: Not on file  Social Connections: Not on file       03/04/2019   12:15 PM 12/04/2018   11:34 AM 09/03/2018   11:38 AM 06/12/2018    8:59 AM 03/11/2018   12:07 PM 03/11/2018    9:05 AM 12/04/2017    9:10 AM  [facility] HIV PREP FLOWSHEET RESULTS  Insurance Status Insured Insured Insured Insured  Insured Insured  How did you hear?       Primary care referral  Gender at birth Male Male Male Male  Male Male  Gender identity cis-Male cis-Male cis-Male cis-Male  cis-Male cis-Male  Risk for HIV  >5 partners in past 6 mos (regardless of condom use) >5 partners in past 6 mos (regardless of condom use)  >5 partners in past 6 mos (regardless of condom use);Hx of STI;Condomless vaginal  or anal intercourse  In sexual relationship with HIV+ partner;Hx of STI  Sex Partners Men only Men only Men only  Men only  Men only  # sex partners past 3-6 mos 1-3 1-3 1-3 1-3  7-9 1-3  Sex activity preferences Insertive and receptive;Oral Insertive and receptive;Oral Insertive and receptive;Oral Insertive and receptive;Oral  Insertive and receptive;Oral Insertive and receptive;Oral  Condom use Yes Yes Yes Yes  Yes Yes  % condom use 100 100 100 99  100 100  Partners genders and ages       M 30-49;M 50+  Treated for STI? No No No No No  No  HIV symptoms? N/A N/A N/A N/A  N/A   PrEP Eligibility Substantial risk for HIV Substantial risk for HIV;HIV negative Substantial risk for HIV Substantial risk for HIV   HIV negative;CrCl >60 ml/min;Substantial risk for HIV  Paper work received?       No    Labs:  SCr: Lab Results  Component Value Date   CREATININE 1.09 08/22/2021   CREATININE 1.00  11/02/2020   CREATININE 1.11 05/10/2020   CREATININE 1.15 11/17/2019   CREATININE 1.06 05/31/2019   HIV Lab Results  Component Value Date   HIV NON-REACTIVE 05/01/2022   HIV NON-REACTIVE 01/30/2022   HIV NON-REACTIVE 11/07/2021   HIV NON-REACTIVE 08/22/2021   HIV NON-REACTIVE 05/24/2021   Hepatitis B Lab Results  Component Value Date   HEPBSAB REACTIVE (A) 12/04/2017   HEPBSAG NEGATIVE 10/09/2016   Hepatitis C No results found for: "HEPCAB", "HCVRNAPCRQN" Hepatitis A Lab Results  Component Value Date   HAV REACTIVE (A) 12/04/2017   RPR and STI Lab Results  Component Value Date   LABRPR NON-REACTIVE 02/22/2021   LABRPR NON-REACTIVE 11/02/2020   LABRPR NON-REACTIVE 05/10/2020   LABRPR NON-REACTIVE 11/17/2019   LABRPR NON-REACTIVE 05/31/2019    STI Results GC CT  05/24/2021  9:17 AM Negative    Negative    Negative  Negative    Negative    Negative   02/22/2021 11:36 AM Negative    Negative    Negative  Negative    Negative    Negative   11/02/2020  9:08 AM Negative    Negative    Negative  Negative    Negative    Negative   08/03/2020  9:54 AM Negative    Negative    Negative  Negative    Negative    Negative   05/10/2020  9:18 AM Negative    Negative  Negative    Negative   11/17/2019  9:25 AM Negative    Negative    Negative  Negative    Negative    Negative   05/31/2019  9:23 AM Negative    Negative    Negative  Negative    Negative    Negative   03/03/2019 12:00 AM Negative    Negative    Negative  Negative    Negative    Negative   12/03/2018 12:00 AM Negative    Negative    Negative  Negative    Negative    Negative   09/03/2018 12:00 AM Negative    Negative    Negative  Negative    Negative    Negative   06/12/2018 12:00 AM Negative    Negative    Negative  Negative    Negative    Negative   03/11/2018 12:00 AM Negative    Negative    Negative  Negative  Negative    Negative   12/04/2017 12:00 AM Negative     Negative    Negative  Negative    Negative    Negative   09/10/2017 12:00 AM Negative  Negative   06/26/2017 12:00 AM Negative    Negative    Negative  Negative    Negative    Negative   03/26/2017 12:00 AM Negative    Negative    Negative  Negative    Negative    Negative   01/14/2017 12:00 AM Negative    Negative    Negative  Negative    Negative    Negative   10/09/2016 12:00 AM Negative    Negative    Negative  Negative    Negative    Negative     Assessment: Egan is here for his routine 3 month HIV PrEP follow up on Truvada. He is doing well on the medication and continues to take it every day without any problems. Requesting STI testing as it has been over a year since he was last screened. Screened for acute HIV symptoms such as fatigue, muscle aches, rash, sore throat, lymphadenopathy, headache, night sweats, nausea/vomiting/diarrhea, and fever. Denies any symptoms.  No issues today. He just had labs drawn at his PCP's office yesterday which included a CMP so will defer checking his kidney function here. He states that he would bring a copy of his lab work for his next appointment.  He inquired about the PrEP injection, Apretude. Discussed what Apretude is and the differences between it and Truvada. Discussed the need to come in every 2 months for an injection and to stay within the 14 day treatment window. Also discussed how the process works and potential side effects. He would like for [place] to query his insurance and see if it would be approved. Will work with Butch Penny on this. He would also like to do his own research and discuss at his next appointment in April.   Plan: - HIV antibody, RPR, urine/rectal/pharyngeal GC/CT swabs for cytology today  - Truvada x 90 days if HIV negative - Look into Apretude - F/u with me in April  Song Myre L. Eber Hong, PharmD, BCIDP, AAHIVP, CPP Clinical Pharmacist Practitioner Infectious [facility] 07/31/2022, 9:01 AM

## 2022-08-01 ENCOUNTER — Telehealth: Payer: Self-pay

## 2022-08-01 LAB — CYTOLOGY, (ORAL, ANAL, URETHRAL) ANCILLARY ONLY
Chlamydia: NEGATIVE
Chlamydia: NEGATIVE
Comment: NEGATIVE
Comment: NEGATIVE
Comment: NORMAL
Comment: NORMAL
Neisseria Gonorrhea: NEGATIVE
Neisseria Gonorrhea: NEGATIVE

## 2022-08-01 LAB — HIV ANTIBODY (ROUTINE TESTING W REFLEX): HIV 1&2 Ab, 4th Generation: NONREACTIVE

## 2022-08-01 LAB — RPR: RPR Ser Ql: NONREACTIVE

## 2022-08-01 LAB — URINE CYTOLOGY ANCILLARY ONLY
Chlamydia: NEGATIVE
Comment: NEGATIVE
Comment: NORMAL
Neisseria Gonorrhea: NEGATIVE

## 2022-08-01 NOTE — Telephone Encounter (Signed)
[facility] Patient Advocate Encounter  I called [employer] and [id number] & [phone] do not need a PA. (Medical Benefits for Apretude)  Ref # [id number] 08/01/22 3:01pm EST  Patient is enrolled in [place] Patient [facility] Phone: [phone] Fax:  [phone]

## 2022-08-02 ENCOUNTER — Other Ambulatory Visit: Payer: Self-pay | Admitting: Pharmacist

## 2022-08-02 DIAGNOSIS — Z7252 High risk homosexual behavior: Secondary | ICD-10-CM

## 2022-08-02 MED ORDER — EMTRICITABINE-TENOFOVIR DF 200-300 MG PO TABS: 1.0000 | ORAL_TABLET | Freq: Every day | ORAL | 0 refills | Status: DC

## 2022-10-14 ENCOUNTER — Other Ambulatory Visit: Payer: Self-pay | Admitting: Pharmacist

## 2022-10-14 DIAGNOSIS — Z7252 High risk homosexual behavior: Secondary | ICD-10-CM

## 2022-10-30 ENCOUNTER — Ambulatory Visit (INDEPENDENT_AMBULATORY_CARE_PROVIDER_SITE_OTHER): Payer: BC Managed Care – PPO | Admitting: Pharmacist

## 2022-10-30 ENCOUNTER — Other Ambulatory Visit: Payer: Self-pay

## 2022-10-30 DIAGNOSIS — Z2981 Encounter for HIV pre-exposure prophylaxis: Secondary | ICD-10-CM | POA: Diagnosis not present

## 2022-10-30 DIAGNOSIS — Z79899 Other long term (current) drug therapy: Secondary | ICD-10-CM

## 2022-10-30 NOTE — Progress Notes (Signed)
Date:  10/30/2022   HPI: Phillip Calhoun is a 52 y.o. male who presents to the [facility] for HIV PrEP follow-up.  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   ASCORBIC ACID (VITAMIN C) 1000 MG TABLET    Take 1,000 mg by mouth daily.   COENZYME Q10 (CO Q10) 100 MG CAPS    Take 100 mg by mouth daily.   EMTRICITABINE-TENOFOVIR (TRUVADA) 200-300 MG TABLET    Take 1 tablet by mouth daily.   LISINOPRIL-HYDROCHLOROTHIAZIDE (PRINZIDE,ZESTORETIC) 10-12.5 MG TABLET    Take 1 tablet by mouth daily.   MULTIPLE VITAMIN (MULTIVITAMIN WITH MINERALS) TABS TABLET    Take 1 tablet by mouth daily.   PROMETHAZINE (PHENERGAN) 25 MG TABLET    Take 25 mg by mouth as needed for nausea. 1 tablet every 6 hours as needed for nausea   SAFFLOWER OIL (CLA) 1000 MG CAPS    Take 1,000 mg by mouth daily.   SUMATRIPTAN (IMITREX) 100 MG TABLET    Take 100 mg by mouth as needed.  Modified Medications   No medications on file  Discontinued Medications   No medications on file    Allergies: Allergies  Allergen Reactions   Emtricitabine-Tenofovir Af     Other reaction(s): rash    Past Medical History: No past medical history on file.  Social History: Social History   Socioeconomic History   Marital status: Divorced    Spouse name: Not on file   Number of children: Not on file   Years of education: Not on file   Highest education level: Not on file  Occupational History   Not on file  Tobacco Use   Smoking status: Never   Smokeless tobacco: Never  Substance and Sexual Activity   Alcohol use: Never   Drug use: Never   Sexual activity: Yes     Partners: Male    Birth control/protection: Condom    Comment: offered condoms  Other Topics Concern   Not on file  Social History Narrative   Not on file   Social Determinants of Health   Financial Resource Strain: Not on file  Food Insecurity: Not on file  Transportation Needs: Not on file  Physical Activity: Not on file  Stress: Not on file  Social Connections: Not on file       03/04/2019   12:15 PM 12/04/2018   11:34 AM 09/03/2018   11:38 AM 06/12/2018    8:59 AM 03/11/2018   12:07 PM 03/11/2018    9:05 AM 12/04/2017    9:10 AM  [facility] HIV PREP FLOWSHEET RESULTS  Insurance Status Insured Insured Insured Insured  Insured Insured  How did you hear?       Primary care referral  Gender at birth Male Male Male Male  Male Male  Gender identity cis-Male cis-Male cis-Male cis-Male  cis-Male cis-Male  Risk for HIV  >5 partners in past 6 mos (regardless of condom use) >5 partners in past 6 mos (regardless of condom use)  >5 partners in past 6 mos (regardless of condom use);Hx of STI;Condomless vaginal  or anal intercourse  In sexual relationship with HIV+ partner;Hx of STI  Sex Partners Men only Men only Men only  Men only  Men only  # sex partners past 3-6 mos 1-3 1-3 1-3 1-3  7-9 1-3  Sex activity preferences Insertive and receptive;Oral Insertive and receptive;Oral Insertive and receptive;Oral Insertive and receptive;Oral  Insertive and receptive;Oral Insertive and receptive;Oral  Condom use Yes Yes Yes Yes  Yes Yes  % condom use 100 100 100 99  100 100  Partners genders and ages       M 30-49;M 50+  Treated for STI? No No No No No  No  HIV symptoms? N/A N/A N/A N/A  N/A   PrEP Eligibility Substantial risk for HIV Substantial risk for HIV;HIV negative Substantial risk for HIV Substantial risk for HIV   HIV negative;CrCl >60 ml/min;Substantial risk for HIV  Paper work received?       No    Labs:  SCr: Lab Results  Component Value Date   CREATININE 1.09 08/22/2021   CREATININE 1.00  11/02/2020   CREATININE 1.11 05/10/2020   CREATININE 1.15 11/17/2019   CREATININE 1.06 05/31/2019   HIV Lab Results  Component Value Date   HIV NON-REACTIVE 07/31/2022   HIV NON-REACTIVE 05/01/2022   HIV NON-REACTIVE 01/30/2022   HIV NON-REACTIVE 11/07/2021   HIV NON-REACTIVE 08/22/2021   Hepatitis B Lab Results  Component Value Date   HEPBSAB REACTIVE (A) 12/04/2017   HEPBSAG NEGATIVE 10/09/2016   Hepatitis C No results found for: "HEPCAB", "HCVRNAPCRQN" Hepatitis A Lab Results  Component Value Date   HAV REACTIVE (A) 12/04/2017   RPR and STI Lab Results  Component Value Date   LABRPR NON-REACTIVE 07/31/2022   LABRPR NON-REACTIVE 02/22/2021   LABRPR NON-REACTIVE 11/02/2020   LABRPR NON-REACTIVE 05/10/2020   LABRPR NON-REACTIVE 11/17/2019    STI Results GC CT  07/31/2022  9:05 AM Negative    Negative    Negative  Negative    Negative    Negative   05/24/2021  9:17 AM Negative    Negative    Negative  Negative    Negative    Negative   02/22/2021 11:36 AM Negative    Negative    Negative  Negative    Negative    Negative   11/02/2020  9:08 AM Negative    Negative    Negative  Negative    Negative    Negative   08/03/2020  9:54 AM Negative    Negative    Negative  Negative    Negative    Negative   05/10/2020  9:18 AM Negative    Negative  Negative    Negative   11/17/2019  9:25 AM Negative    Negative    Negative  Negative    Negative    Negative   05/31/2019  9:23 AM Negative    Negative    Negative  Negative    Negative    Negative   03/03/2019 12:00 AM Negative    Negative    Negative  Negative    Negative    Negative   12/03/2018 12:00 AM Negative    Negative    Negative  Negative    Negative    Negative   09/03/2018 12:00 AM Negative    Negative    Negative  Negative    Negative    Negative   06/12/2018 12:00 AM Negative    Negative    Negative  Negative  Negative    Negative   03/11/2018 12:00 AM Negative     Negative    Negative  Negative    Negative    Negative   12/04/2017 12:00 AM Negative    Negative    Negative  Negative    Negative    Negative   09/10/2017 12:00 AM Negative  Negative   06/26/2017 12:00 AM Negative    Negative    Negative  Negative    Negative    Negative   03/26/2017 12:00 AM Negative    Negative    Negative  Negative    Negative    Negative   01/14/2017 12:00 AM Negative    Negative    Negative  Negative    Negative    Negative   10/09/2016 12:00 AM Negative    Negative    Negative  Negative    Negative    Negative     Assessment: Karry is here today for his 3 month PrEP follow up. Continues to do well on Truvada with no issues or problems. He gets a 90 day supply filled at [facility]. He takes it every day without side effects or missed doses. No new partners and politely decline STI testing today. He inquired about Apretude during his last appointment but has decided to stay on Truvada,. Will check HIV status today and refill his Truvada if he remains negative. He had his kidney function checked back in January 2024 and has a physical scheduled for this summer.   Plan: - HIV antibody today - Truvada x 90 days if HIV negative - F/u with me on 01/29/23  Johnika Escareno L. Babe Clenney, PharmD, BCIDP, AAHIVP, CPP Clinical Pharmacist Practitioner Infectious Diseases Clinical Pharmacist [facility] 10/30/2022, 8:51 AM

## 2022-10-31 ENCOUNTER — Other Ambulatory Visit: Payer: Self-pay | Admitting: Pharmacist

## 2022-10-31 DIAGNOSIS — Z79899 Other long term (current) drug therapy: Secondary | ICD-10-CM

## 2022-10-31 LAB — HIV ANTIBODY (ROUTINE TESTING W REFLEX): HIV 1&2 Ab, 4th Generation: NONREACTIVE

## 2022-10-31 MED ORDER — EMTRICITABINE-TENOFOVIR DF 200-300 MG PO TABS: 1.0000 | ORAL_TABLET | Freq: Every day | ORAL | 0 refills | Status: DC

## 2023-01-08 ENCOUNTER — Other Ambulatory Visit: Payer: Self-pay | Admitting: Pharmacist

## 2023-01-08 DIAGNOSIS — Z79899 Other long term (current) drug therapy: Secondary | ICD-10-CM

## 2023-01-29 ENCOUNTER — Ambulatory Visit: Payer: BC Managed Care – PPO | Admitting: Pharmacist

## 2023-01-29 ENCOUNTER — Other Ambulatory Visit: Payer: Self-pay

## 2023-01-29 DIAGNOSIS — Z2981 Encounter for HIV pre-exposure prophylaxis: Secondary | ICD-10-CM

## 2023-01-29 DIAGNOSIS — Z79899 Other long term (current) drug therapy: Secondary | ICD-10-CM

## 2023-01-29 MED ORDER — EMTRICITABINE-TENOFOVIR DF 200-300 MG PO TABS
1.0000 | ORAL_TABLET | Freq: Every day | ORAL | 0 refills | Status: DC
Start: 2023-01-29 — End: 2023-04-23

## 2023-01-29 NOTE — Progress Notes (Signed)
Date:  01/29/2023   HPI: Phillip Calhoun is a 52 y.o. male who presents to the [facility] for HIV PrEP follow-up.  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   ASCORBIC ACID (VITAMIN C) 1000 MG TABLET    Take 1,000 mg by mouth daily.   COENZYME Q10 (CO Q10) 100 MG CAPS    Take 100 mg by mouth daily.   EMTRICITABINE-TENOFOVIR (TRUVADA) 200-300 MG TABLET    Take 1 tablet by mouth daily.   LISINOPRIL-HYDROCHLOROTHIAZIDE (PRINZIDE,ZESTORETIC) 10-12.5 MG TABLET    Take 1 tablet by mouth daily.   MULTIPLE VITAMIN (MULTIVITAMIN WITH MINERALS) TABS TABLET    Take 1 tablet by mouth daily.   PROMETHAZINE (PHENERGAN) 25 MG TABLET    Take 25 mg by mouth as needed for nausea. 1 tablet every 6 hours as needed for nausea   SAFFLOWER OIL (CLA) 1000 MG CAPS    Take 1,000 mg by mouth daily.   SUMATRIPTAN (IMITREX) 100 MG TABLET    Take 100 mg by mouth as needed.  Modified Medications   No medications on file  Discontinued Medications   No medications on file       03/04/2019   12:15 PM 12/04/2018   11:34 AM 09/03/2018   11:38 AM 06/12/2018    8:59 AM 03/11/2018   12:07 PM 03/11/2018    9:05 AM 12/04/2017    9:10 AM  [facility] HIV PREP FLOWSHEET RESULTS  Insurance Status Insured Insured Insured Insured  Insured Insured  How did you hear?       Primary care referral  Gender at birth Male Male Male Male  Male Male  Gender identity cis-Male cis-Male cis-Male cis-Male  cis-Male cis-Male  Risk for HIV  >5 partners in past 6 mos (regardless of condom use) >5 partners in past 6 mos (regardless of condom use)  >5 partners in past 6 mos (regardless of condom  use);Hx of STI;Condomless vaginal or anal intercourse  In sexual relationship with HIV+ partner;Hx of STI  Sex Partners Men only Men only Men only  Men only  Men only  # sex partners past 3-6 mos 1-3 1-3 1-3 1-3  7-9 1-3  Sex activity preferences Insertive and receptive;Oral Insertive and receptive;Oral Insertive and receptive;Oral Insertive and receptive;Oral  Insertive and receptive;Oral Insertive and receptive;Oral  Condom use Yes Yes Yes Yes  Yes Yes  % condom use 100 100 100 99  100 100  Partners genders and ages       M 30-49;M 50+  Treated for STI? No No No No No  No  HIV symptoms? N/A N/A N/A N/A  N/A   PrEP Eligibility Substantial risk for HIV Substantial risk for HIV;HIV negative Substantial risk for HIV Substantial risk for HIV   HIV negative;CrCl >60 ml/min;Substantial risk for HIV  Paper work received?       No    Labs:  SCr: Lab Results  Component Value Date   CREATININE 1.09 08/22/2021   CREATININE 1.00 11/02/2020   CREATININE 1.11 05/10/2020   CREATININE 1.15 11/17/2019   CREATININE 1.06  05/31/2019   HIV Lab Results  Component Value Date   HIV NON-REACTIVE 10/30/2022   HIV NON-REACTIVE 07/31/2022   HIV NON-REACTIVE 05/01/2022   HIV NON-REACTIVE 01/30/2022   HIV NON-REACTIVE 11/07/2021   Hepatitis B Lab Results  Component Value Date   HEPBSAB REACTIVE (A) 12/04/2017   HEPBSAG NEGATIVE 10/09/2016   Hepatitis C No results found for: "HEPCAB", "HCVRNAPCRQN" Hepatitis A Lab Results  Component Value Date   HAV REACTIVE (A) 12/04/2017   RPR and STI Lab Results  Component Value Date   LABRPR NON-REACTIVE 07/31/2022   LABRPR NON-REACTIVE 02/22/2021   LABRPR NON-REACTIVE 11/02/2020   LABRPR NON-REACTIVE 05/10/2020   LABRPR NON-REACTIVE 11/17/2019    STI Results GC CT  07/31/2022  9:05 AM Negative    Negative    Negative  Negative    Negative    Negative   05/24/2021  9:17 AM Negative    Negative    Negative  Negative    Negative    Negative    02/22/2021 11:36 AM Negative    Negative    Negative  Negative    Negative    Negative   11/02/2020  9:08 AM Negative    Negative    Negative  Negative    Negative    Negative   08/03/2020  9:54 AM Negative    Negative    Negative  Negative    Negative    Negative   05/10/2020  9:18 AM Negative    Negative  Negative    Negative   11/17/2019  9:25 AM Negative    Negative    Negative  Negative    Negative    Negative   05/31/2019  9:23 AM Negative    Negative    Negative  Negative    Negative    Negative   03/03/2019 12:00 AM Negative    Negative    Negative  Negative    Negative    Negative   12/03/2018 12:00 AM Negative    Negative    Negative  Negative    Negative    Negative   09/03/2018 12:00 AM Negative    Negative    Negative  Negative    Negative    Negative   06/12/2018 12:00 AM Negative    Negative    Negative  Negative    Negative    Negative   03/11/2018 12:00 AM Negative    Negative    Negative  Negative    Negative    Negative   12/04/2017 12:00 AM Negative    Negative    Negative  Negative    Negative    Negative   09/10/2017 12:00 AM Negative  Negative   06/26/2017 12:00 AM Negative    Negative    Negative  Negative    Negative    Negative   03/26/2017 12:00 AM Negative    Negative    Negative  Negative    Negative    Negative   01/14/2017 12:00 AM Negative    Negative    Negative  Negative    Negative    Negative   10/09/2016 12:00 AM Negative    Negative    Negative  Negative    Negative    Negative     Assessment: Phillip Calhoun is here today to follow up for HIV PrEP. He is doing well on Truvada. Has not been sexually active so declines STI testing today. Just got a new job at [employer] to help get their test scores up. Will check HIV and see him back in 3 months. He is seeing his PCP in August and will get his kidney function checked at that appointment.   Plan: - HIV antibody - Truvada x 3 months if HIV  negative - F/u with in October  Clay Solum L. Devone Bonilla, PharmD, BCIDP, AAHIVP, CPP Clinical Pharmacist Practitioner Infectious Diseases Clinical Pharmacist [facility] 01/29/2023, 3:26 PM

## 2023-01-30 LAB — HIV ANTIBODY (ROUTINE TESTING W REFLEX): HIV 1&2 Ab, 4th Generation: NONREACTIVE

## 2023-04-15 ENCOUNTER — Other Ambulatory Visit: Payer: Self-pay | Admitting: Pharmacist

## 2023-04-15 DIAGNOSIS — Z79899 Other long term (current) drug therapy: Secondary | ICD-10-CM

## 2023-04-18 NOTE — Progress Notes (Signed)
HPI: Phillip Calhoun is a 52 y.o. male who presents to the [facility] clinic for HIV PrEP follow-up.  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   ASCORBIC ACID (VITAMIN C) 1000 MG TABLET    Take 1,000 mg by mouth daily.   COENZYME Q10 (CO Q10) 100 MG CAPS    Take 100 mg by mouth daily.   EMTRICITABINE-TENOFOVIR (TRUVADA) 200-300 MG TABLET    Take 1 tablet by mouth daily.   LISINOPRIL-HYDROCHLOROTHIAZIDE (PRINZIDE,ZESTORETIC) 10-12.5 MG TABLET    Take 1 tablet by mouth daily.   MULTIPLE VITAMIN (MULTIVITAMIN WITH MINERALS) TABS TABLET    Take 1 tablet by mouth daily.   PROMETHAZINE (PHENERGAN) 25 MG TABLET    Take 25 mg by mouth as needed for nausea. 1 tablet every 6 hours as needed for nausea   SAFFLOWER OIL (CLA) 1000 MG CAPS    Take 1,000 mg by mouth daily.   SUMATRIPTAN (IMITREX) 100 MG TABLET    Take 100 mg by mouth as needed.  Modified Medications   No medications on file  Discontinued Medications   No medications on file       03/04/2019   12:15 PM 12/04/2018   11:34 AM 09/03/2018   11:38 AM 06/12/2018    8:59 AM 03/11/2018   12:07 PM 03/11/2018    9:05 AM 12/04/2017    9:10 AM  [facility] HIV PREP FLOWSHEET RESULTS  Insurance Status Insured Insured Insured Insured  Insured Insured  How did you hear?       Primary care referral  Gender at birth Male Male Male Male  Male Male  Gender identity cis-Male cis-Male cis-Male cis-Male  cis-Male cis-Male  Risk for HIV  >5 partners in past 6 mos (regardless of condom use) >5 partners in past 6 mos (regardless of condom use)  >5 partners in past 6 mos (regardless of condom use);Hx of STI;Condomless  vaginal or anal intercourse  In sexual relationship with HIV+ partner;Hx of STI  Sex Partners Men only Men only Men only  Men only  Men only  # sex partners past 3-6 mos 1-3 1-3 1-3 1-3  7-9 1-3  Sex activity preferences Insertive and receptive;Oral Insertive and receptive;Oral Insertive and receptive;Oral Insertive and receptive;Oral  Insertive and receptive;Oral Insertive and receptive;Oral  Condom use Yes Yes Yes Yes  Yes Yes  % condom use 100 100 100 99  100 100  Partners genders and ages       M 30-49;M 50+  Treated for STI? No No No No No  No  HIV symptoms? N/A N/A N/A N/A  N/A   PrEP Eligibility Substantial risk for HIV Substantial risk for HIV;HIV negative Substantial risk for HIV Substantial risk for HIV   HIV negative;CrCl >60 ml/min;Substantial risk for HIV  Paper work received?       No    Labs:  SCr: Lab Results  Component Value Date   CREATININE 1.09 08/22/2021   CREATININE 1.00 11/02/2020   CREATININE 1.11 05/10/2020   CREATININE 1.15 11/17/2019   CREATININE 1.06 05/31/2019   HIV Lab  Results  Component Value Date   HIV NON-REACTIVE 01/29/2023   HIV NON-REACTIVE 10/30/2022   HIV NON-REACTIVE 07/31/2022   HIV NON-REACTIVE 05/01/2022   HIV NON-REACTIVE 01/30/2022   Hepatitis B Lab Results  Component Value Date   HEPBSAB REACTIVE (A) 12/04/2017   HEPBSAG NEGATIVE 10/09/2016   Hepatitis C No results found for: "HEPCAB", "HCVRNAPCRQN" Hepatitis A Lab Results  Component Value Date   HAV REACTIVE (A) 12/04/2017   RPR and STI Lab Results  Component Value Date   LABRPR NON-REACTIVE 07/31/2022   LABRPR NON-REACTIVE 02/22/2021   LABRPR NON-REACTIVE 11/02/2020   LABRPR NON-REACTIVE 05/10/2020   LABRPR NON-REACTIVE 11/17/2019    STI Results GC CT  07/31/2022  9:05 AM Negative    Negative    Negative  Negative    Negative    Negative   05/24/2021  9:17 AM Negative    Negative    Negative  Negative    Negative    Negative   02/22/2021 11:36 AM  Negative    Negative    Negative  Negative    Negative    Negative   11/02/2020  9:08 AM Negative    Negative    Negative  Negative    Negative    Negative   08/03/2020  9:54 AM Negative    Negative    Negative  Negative    Negative    Negative   05/10/2020  9:18 AM Negative    Negative  Negative    Negative   11/17/2019  9:25 AM Negative    Negative    Negative  Negative    Negative    Negative   05/31/2019  9:23 AM Negative    Negative    Negative  Negative    Negative    Negative   03/03/2019 12:00 AM Negative    Negative    Negative  Negative    Negative    Negative   12/03/2018 12:00 AM Negative    Negative    Negative  Negative    Negative    Negative   09/03/2018 12:00 AM Negative    Negative    Negative  Negative    Negative    Negative   06/12/2018 12:00 AM Negative    Negative    Negative  Negative    Negative    Negative   03/11/2018 12:00 AM Negative    Negative    Negative  Negative    Negative    Negative   12/04/2017 12:00 AM Negative    Negative    Negative  Negative    Negative    Negative   09/10/2017 12:00 AM Negative  Negative   06/26/2017 12:00 AM Negative    Negative    Negative  Negative    Negative    Negative   03/26/2017 12:00 AM Negative    Negative    Negative  Negative    Negative    Negative   01/14/2017 12:00 AM Negative    Negative    Negative  Negative    Negative    Negative   10/09/2016 12:00 AM Negative    Negative    Negative  Negative    Negative    Negative     Assessment: Phillip Calhoun comes in today for HIV PrEP follow up. He has been doing well on Truvada with no problems or concerns. Fills it through [facility] and has no trouble getting his refills. Screened for acute HIV  symptoms such as fatigue, muscle aches, rash, sore throat, lymphadenopathy, headache, night sweats, nausea/vomiting/diarrhea, and fever. Denies any symptoms.  Last STI testing was in January and was negative. Politely  declines STI testing.  Agrees to receive the annual flu and 2024-2025 COVID vaccines today. His PCP checks his kidney function twice yearly so will defer checking that here today.  Plan: - HIV antibody today - Administered the annual flu vaccine today - Administered the 2024-2025 COVID vaccine today - Truvada x 3 months if HIV negative - Follow up with me again on 07/30/23  Marqueta Pulley L. Dulse Rutan, PharmD, BCIDP, AAHIVP, CPP Clinical Pharmacist Practitioner Infectious Diseases Clinical Pharmacist [facility] 04/18/2023, 1:29 PM

## 2023-04-21 ENCOUNTER — Ambulatory Visit (INDEPENDENT_AMBULATORY_CARE_PROVIDER_SITE_OTHER): Payer: BC Managed Care – PPO | Admitting: Pharmacist

## 2023-04-21 ENCOUNTER — Other Ambulatory Visit: Payer: Self-pay

## 2023-04-21 DIAGNOSIS — Z79899 Other long term (current) drug therapy: Secondary | ICD-10-CM

## 2023-04-21 DIAGNOSIS — Z2981 Encounter for HIV pre-exposure prophylaxis: Secondary | ICD-10-CM | POA: Diagnosis not present

## 2023-04-21 DIAGNOSIS — Z113 Encounter for screening for infections with a predominantly sexual mode of transmission: Secondary | ICD-10-CM

## 2023-04-21 DIAGNOSIS — Z23 Encounter for immunization: Secondary | ICD-10-CM

## 2023-04-22 LAB — HIV ANTIBODY (ROUTINE TESTING W REFLEX): HIV 1&2 Ab, 4th Generation: NONREACTIVE

## 2023-04-23 ENCOUNTER — Other Ambulatory Visit: Payer: Self-pay | Admitting: Pharmacist

## 2023-04-23 DIAGNOSIS — Z79899 Other long term (current) drug therapy: Secondary | ICD-10-CM

## 2023-04-23 MED ORDER — EMTRICITABINE-TENOFOVIR DF 200-300 MG PO TABS: 1.0000 | ORAL_TABLET | Freq: Every day | ORAL | 0 refills | Status: DC

## 2023-04-24 ENCOUNTER — Ambulatory Visit: Payer: BC Managed Care – PPO | Admitting: Pharmacist

## 2023-07-10 ENCOUNTER — Other Ambulatory Visit: Payer: Self-pay | Admitting: Pharmacist

## 2023-07-10 DIAGNOSIS — Z79899 Other long term (current) drug therapy: Secondary | ICD-10-CM

## 2023-07-30 ENCOUNTER — Other Ambulatory Visit: Payer: Self-pay

## 2023-07-30 ENCOUNTER — Telehealth: Payer: Self-pay

## 2023-07-30 ENCOUNTER — Other Ambulatory Visit (HOSPITAL_COMMUNITY)
Admission: RE | Admit: 2023-07-30 | Discharge: 2023-07-30 | Disposition: A | Discharge status: Home / Self Care | Payer: 59 | Source: Ambulatory Visit | Attending: Infectious Disease | Admitting: Infectious Disease

## 2023-07-30 ENCOUNTER — Ambulatory Visit (INDEPENDENT_AMBULATORY_CARE_PROVIDER_SITE_OTHER): Payer: 59 | Admitting: Pharmacist

## 2023-07-30 ENCOUNTER — Other Ambulatory Visit (HOSPITAL_COMMUNITY): Payer: Self-pay

## 2023-07-30 DIAGNOSIS — Z79899 Other long term (current) drug therapy: Secondary | ICD-10-CM | POA: Diagnosis not present

## 2023-07-30 DIAGNOSIS — Z113 Encounter for screening for infections with a predominantly sexual mode of transmission: Secondary | ICD-10-CM | POA: Diagnosis present

## 2023-07-30 LAB — CYTOLOGY, (ORAL, ANAL, URETHRAL) ANCILLARY ONLY
Chlamydia: NEGATIVE
Chlamydia: NEGATIVE
Comment: NEGATIVE
Comment: NEGATIVE
Comment: NORMAL
Comment: NORMAL
Neisseria Gonorrhea: NEGATIVE
Neisseria Gonorrhea: NEGATIVE

## 2023-07-30 LAB — URINE CYTOLOGY ANCILLARY ONLY
Chlamydia: NEGATIVE
Comment: NEGATIVE
Comment: NORMAL
Neisseria Gonorrhea: NEGATIVE

## 2023-07-30 NOTE — Telephone Encounter (Signed)
Pharmacy Patient Advocate Encounter  Insurance verification completed.   The patient is insured through [facility]   Ran test claim for Descovy, Apretude & Truvada. Currently a quantity of 30 is a 30 day supply .  Prescription will need to be filed with [facility].  This test claim was processed through [facility]- copay amounts may vary at other pharmacies due to pharmacy/plan contracts, or as the patient moves through the different stages of their insurance plan.

## 2023-07-30 NOTE — Progress Notes (Unsigned)
HPI: Phillip Calhoun is a 53 y.o. male who presents to the [facility] pharmacy clinic for HIV PrEP follow-up.  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   ASCORBIC ACID (VITAMIN C) 1000 MG TABLET    Take 1,000 mg by mouth daily.   COENZYME Q10 (CO Q10) 100 MG CAPS    Take 100 mg by mouth daily.   EMTRICITABINE-TENOFOVIR (TRUVADA) 200-300 MG TABLET    Take 1 tablet by mouth daily.   LISINOPRIL-HYDROCHLOROTHIAZIDE (PRINZIDE,ZESTORETIC) 10-12.5 MG TABLET    Take 1 tablet by mouth daily.   MULTIPLE VITAMIN (MULTIVITAMIN WITH MINERALS) TABS TABLET    Take 1 tablet by mouth daily.   PROMETHAZINE (PHENERGAN) 25 MG TABLET    Take 25 mg by mouth as needed for nausea. 1 tablet every 6 hours as needed for nausea   SAFFLOWER OIL (CLA) 1000 MG CAPS    Take 1,000 mg by mouth daily.   SUMATRIPTAN (IMITREX) 100 MG TABLET    Take 100 mg by mouth as needed.  Modified Medications   No medications on file  Discontinued Medications   No medications on file       03/04/2019   12:15 PM 12/04/2018   11:34 AM 09/03/2018   11:38 AM 06/12/2018    8:59 AM 03/11/2018   12:07 PM 03/11/2018    9:05 AM 12/04/2017    9:10 AM  [facility] HIV PREP FLOWSHEET RESULTS  Insurance Status Insured Insured Insured Insured  Insured Insured  How did you hear?       Primary care referral  Gender at birth Male Male Male Male  Male Male  Gender identity cis-Male cis-Male cis-Male cis-Male  cis-Male cis-Male  Risk for HIV  >5 partners in past 6 mos (regardless of condom use) >5 partners in past 6 mos (regardless of condom use)  >5 partners in past 6 mos (regardless of condom use);Hx of STI;Condomless  vaginal or anal intercourse  In sexual relationship with HIV+ partner;Hx of STI  Sex Partners Men only Men only Men only  Men only  Men only  # sex partners past 3-6 mos 1-3 1-3 1-3 1-3  7-9 1-3  Sex activity preferences Insertive and receptive;Oral Insertive and receptive;Oral Insertive and receptive;Oral Insertive and receptive;Oral  Insertive and receptive;Oral Insertive and receptive;Oral  Condom use Yes Yes Yes Yes  Yes Yes  % condom use 100 100 100 99  100 100  Partners genders and ages       M 30-49;M 50+  Treated for STI? No No No No No  No  HIV symptoms? N/A N/A N/A N/A  N/A   PrEP Eligibility Substantial risk for HIV Substantial risk for HIV;HIV negative Substantial risk for HIV Substantial risk for HIV   HIV negative;CrCl >60 ml/min;Substantial risk for HIV  Paper work received?       No    Labs:  SCr: Lab Results  Component Value Date   CREATININE 1.09 08/22/2021   CREATININE 1.00 11/02/2020   CREATININE 1.11 05/10/2020   CREATININE 1.15 11/17/2019   CREATININE 1.06 05/31/2019   HIV Lab  Results  Component Value Date   HIV NON-REACTIVE 04/21/2023   HIV NON-REACTIVE 01/29/2023   HIV NON-REACTIVE 10/30/2022   HIV NON-REACTIVE 07/31/2022   HIV NON-REACTIVE 05/01/2022   Hepatitis B Lab Results  Component Value Date   HEPBSAB REACTIVE (A) 12/04/2017   HEPBSAG NEGATIVE 10/09/2016   Hepatitis C No results found for: "HEPCAB", "HCVRNAPCRQN" Hepatitis A Lab Results  Component Value Date   HAV REACTIVE (A) 12/04/2017   RPR and STI Lab Results  Component Value Date   LABRPR NON-REACTIVE 07/31/2022   LABRPR NON-REACTIVE 02/22/2021   LABRPR NON-REACTIVE 11/02/2020   LABRPR NON-REACTIVE 05/10/2020   LABRPR NON-REACTIVE 11/17/2019    STI Results GC CT  07/31/2022  9:05 AM Negative    Negative    Negative  Negative    Negative    Negative   05/24/2021  9:17 AM Negative    Negative    Negative  Negative    Negative    Negative   02/22/2021 11:36 AM  Negative    Negative    Negative  Negative    Negative    Negative   11/02/2020  9:08 AM Negative    Negative    Negative  Negative    Negative    Negative   08/03/2020  9:54 AM Negative    Negative    Negative  Negative    Negative    Negative   05/10/2020  9:18 AM Negative    Negative  Negative    Negative   11/17/2019  9:25 AM Negative    Negative    Negative  Negative    Negative    Negative   05/31/2019  9:23 AM Negative    Negative    Negative  Negative    Negative    Negative   03/03/2019 12:00 AM Negative    Negative    Negative  Negative    Negative    Negative   12/03/2018 12:00 AM Negative    Negative    Negative  Negative    Negative    Negative   09/03/2018 12:00 AM Negative    Negative    Negative  Negative    Negative    Negative   06/12/2018 12:00 AM Negative    Negative    Negative  Negative    Negative    Negative   03/11/2018 12:00 AM Negative    Negative    Negative  Negative    Negative    Negative   12/04/2017 12:00 AM Negative    Negative    Negative  Negative    Negative    Negative   09/10/2017 12:00 AM Negative  Negative   06/26/2017 12:00 AM Negative    Negative    Negative  Negative    Negative    Negative   03/26/2017 12:00 AM Negative    Negative    Negative  Negative    Negative    Negative   01/14/2017 12:00 AM Negative    Negative    Negative  Negative    Negative    Negative   10/09/2016 12:00 AM Negative    Negative    Negative  Negative    Negative    Negative     Assessment: Phillip Calhoun presents today for their 3 month PrEP follow up with Truvada. Last PrEP follow up on 04/21/23. HIV Ab negative from last visit. Reports no issue with Truvada. Optimal adherence, reporting no missed doses. Denies signs and  symptoms of acute HIV infection or STI. Last STI test from 07/2022 with RPR and urine/oral/rectal cytologies negative for syphilis/chlamydia/gonorrhea. Agrees to do full STI testing today with RPR and  urine/oral/rectal cytologies.   Brought a hard copy of his recent labs done through his PCP from 07/21/23. Will make a copy. CMP, CBC, and lipids normal except elevated LDL cholesterol cal (NIH) at 132. Labs being followed by PCP biannually.   Insurance: Patient reports new insurance provider. Now with [employer]. [facility]. Patient will inform [facility] pharmacy clinic if script needs to be sent to a different pharmacy.   Vaccination: due for Tdap booster and PCV20. Patient requested to defer to next follow up visit in 3 months due to upcoming plans.   Plan: - HIV Ab - Refill Truvada for 3 months HIV Ab is negative. [facility] - STI testing with RPR and urine/oral/rectal cytologies - Next follow up visit scheduled for 10/15/2023 - Deferred Tdap and PCV20 to next follow up visit  - Call with any questions or concerns  Dimple Casey. Edwena Blow, PharmD Candidate [facility] [employer] 07/30/2023, 9:08 AM

## 2023-07-31 ENCOUNTER — Telehealth: Payer: Self-pay

## 2023-07-31 ENCOUNTER — Other Ambulatory Visit: Payer: Self-pay

## 2023-07-31 DIAGNOSIS — Z79899 Other long term (current) drug therapy: Secondary | ICD-10-CM

## 2023-07-31 LAB — HIV ANTIBODY (ROUTINE TESTING W REFLEX): HIV 1&2 Ab, 4th Generation: NONREACTIVE

## 2023-07-31 LAB — RPR: RPR Ser Ql: NONREACTIVE

## 2023-07-31 MED ORDER — EMTRICITABINE-TENOFOVIR DF 200-300 MG PO TABS
1.0000 | ORAL_TABLET | Freq: Every day | ORAL | 0 refills | Status: DC
Start: 2023-07-31 — End: 2023-10-17

## 2023-07-31 NOTE — Telephone Encounter (Addendum)
Called Phillip Calhoun today to inform him that his RPR and oral/rectal/urine cytologies returned negative for syphilis, gonorrhea, and chlamydia. Asked him where he'd like [place] to send his Truvada prescription. He'd like [place] to send a 90DS to [facility]. He also requested the generic form of Truvada for insurance coverage purposes.  Sent refill for Truvada with 90DS 0 refills to [facility].

## 2023-10-14 NOTE — Progress Notes (Unsigned)
 HPI: Phillip Calhoun is a 53 y.o. male who presents to the [facility] pharmacy clinic for HIV PrEP follow-up.  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   ASCORBIC ACID (VITAMIN C) 1000 MG TABLET    Take 1,000 mg by mouth daily.   COENZYME Q10 (CO Q10) 100 MG CAPS    Take 100 mg by mouth daily.   EMTRICITABINE-TENOFOVIR (TRUVADA) 200-300 MG TABLET    Take 1 tablet by mouth daily.   LISINOPRIL-HYDROCHLOROTHIAZIDE (PRINZIDE,ZESTORETIC) 10-12.5 MG TABLET    Take 1 tablet by mouth daily.   MULTIPLE VITAMIN (MULTIVITAMIN WITH MINERALS) TABS TABLET    Take 1 tablet by mouth daily.   PROMETHAZINE (PHENERGAN) 25 MG TABLET    Take 25 mg by mouth as needed for nausea. 1 tablet every 6 hours as needed for nausea   SAFFLOWER OIL (CLA) 1000 MG CAPS    Take 1,000 mg by mouth daily.   SUMATRIPTAN (IMITREX) 100 MG TABLET    Take 100 mg by mouth as needed.  Modified Medications   No medications on file  Discontinued Medications   No medications on file       03/04/2019   12:15 PM 12/04/2018   11:34 AM 09/03/2018   11:38 AM 06/12/2018    8:59 AM 03/11/2018   12:07 PM 03/11/2018    9:05 AM 12/04/2017    9:10 AM  [facility] HIV PREP FLOWSHEET RESULTS  Insurance Status Insured Insured Insured Insured  Insured Insured  How did you hear?       Primary care referral  Gender at birth Male Male Male Male  Male Male  Gender identity cis-Male cis-Male cis-Male cis-Male  cis-Male cis-Male  Risk for HIV  >5 partners in past 6 mos (regardless of condom use) >5 partners in past 6 mos (regardless of condom use)  >5 partners in past 6 mos (regardless of condom use);Hx of STI;Condomless  vaginal or anal intercourse  In sexual relationship with HIV+ partner;Hx of STI  Sex Partners Men only Men only Men only  Men only  Men only  # sex partners past 3-6 mos 1-3 1-3 1-3 1-3  7-9 1-3  Sex activity preferences Insertive and receptive;Oral Insertive and receptive;Oral Insertive and receptive;Oral Insertive and receptive;Oral  Insertive and receptive;Oral Insertive and receptive;Oral  Condom use Yes Yes Yes Yes  Yes Yes  % condom use 100 100 100 99  100 100  Partners genders and ages       M 30-49;M 50+  Treated for STI? No No No No No  No  HIV symptoms? N/A N/A N/A N/A  N/A   PrEP Eligibility Substantial risk for HIV Substantial risk for HIV;HIV negative Substantial risk for HIV Substantial risk for HIV   HIV negative;CrCl >60 ml/min;Substantial risk for HIV  Paper work received?       No    Labs:  SCr: Lab Results  Component Value Date   CREATININE 1.09 08/22/2021   CREATININE 1.00 11/02/2020   CREATININE 1.11 05/10/2020   CREATININE 1.15 11/17/2019   CREATININE 1.06 05/31/2019   HIV Lab  Results  Component Value Date   HIV NON-REACTIVE 07/30/2023   HIV NON-REACTIVE 04/21/2023   HIV NON-REACTIVE 01/29/2023   HIV NON-REACTIVE 10/30/2022   HIV NON-REACTIVE 07/31/2022   Hepatitis B Lab Results  Component Value Date   HEPBSAB REACTIVE (A) 12/04/2017   HEPBSAG NEGATIVE 10/09/2016   Hepatitis C No results found for: "HEPCAB", "HCVRNAPCRQN" Hepatitis A Lab Results  Component Value Date   HAV REACTIVE (A) 12/04/2017   RPR and STI Lab Results  Component Value Date   LABRPR NON-REACTIVE 07/30/2023   LABRPR NON-REACTIVE 07/31/2022   LABRPR NON-REACTIVE 02/22/2021   LABRPR NON-REACTIVE 11/02/2020   LABRPR NON-REACTIVE 05/10/2020    STI Results GC CT  07/30/2023  9:02 AM Negative    Negative    Negative  Negative    Negative    Negative   07/31/2022  9:05 AM Negative    Negative    Negative  Negative    Negative    Negative   05/24/2021  9:17 AM  Negative    Negative    Negative  Negative    Negative    Negative   02/22/2021 11:36 AM Negative    Negative    Negative  Negative    Negative    Negative   11/02/2020  9:08 AM Negative    Negative    Negative  Negative    Negative    Negative   08/03/2020  9:54 AM Negative    Negative    Negative  Negative    Negative    Negative   05/10/2020  9:18 AM Negative    Negative  Negative    Negative   11/17/2019  9:25 AM Negative    Negative    Negative  Negative    Negative    Negative   05/31/2019  9:23 AM Negative    Negative    Negative  Negative    Negative    Negative   03/03/2019 12:00 AM Negative    Negative    Negative  Negative    Negative    Negative   12/03/2018 12:00 AM Negative    Negative    Negative  Negative    Negative    Negative   09/03/2018 12:00 AM Negative    Negative    Negative  Negative    Negative    Negative   06/12/2018 12:00 AM Negative    Negative    Negative  Negative    Negative    Negative   03/11/2018 12:00 AM Negative    Negative    Negative  Negative    Negative    Negative   12/04/2017 12:00 AM Negative    Negative    Negative  Negative    Negative    Negative   09/10/2017 12:00 AM Negative  Negative   06/26/2017 12:00 AM Negative    Negative    Negative  Negative    Negative    Negative   03/26/2017 12:00 AM Negative    Negative    Negative  Negative    Negative    Negative   01/14/2017 12:00 AM Negative    Negative    Negative  Negative    Negative    Negative   10/09/2016 12:00 AM Negative    Negative    Negative  Negative    Negative    Negative     Assessment: Phillip Calhoun presents today for 3 month PrEP follow-up with Truvada. Last PrEP follow-up  was 07/30/23. Reports no issues with Truvada.Screened for acute HIV symptoms such as fatigue, muscle aches, rash, sore throat, lymphadenopathy, headache, night sweats, nausea/vomiting/diarrhea, and fever. Denies any symptoms.   Last STI testing on  07/30/2023 for RPR, urine/oral/rectal cytologies for gonorrhea/chlamydia, all negative. Declined STI testing today.   Due for PCV20 and Tdap booster. Deferred at last visit. Agreed to PCV20 today, will defer Tdap booster to next visit.  Plan: - HIV antibody today - [facility]ill [facility] x3 months [facility] - Follow-up scheduled for 01/21/2024 - Administered PCV20 today - Call with any questions and or concerns  Haze Boyden PharmD Candidate

## 2023-10-15 ENCOUNTER — Ambulatory Visit (INDEPENDENT_AMBULATORY_CARE_PROVIDER_SITE_OTHER): Payer: 59 | Admitting: Pharmacist

## 2023-10-15 ENCOUNTER — Other Ambulatory Visit: Payer: Self-pay

## 2023-10-15 ENCOUNTER — Other Ambulatory Visit: Payer: Self-pay | Admitting: Pharmacist

## 2023-10-15 DIAGNOSIS — Z79899 Other long term (current) drug therapy: Secondary | ICD-10-CM

## 2023-10-15 DIAGNOSIS — Z23 Encounter for immunization: Secondary | ICD-10-CM | POA: Diagnosis not present

## 2023-10-15 DIAGNOSIS — Z113 Encounter for screening for infections with a predominantly sexual mode of transmission: Secondary | ICD-10-CM

## 2023-10-16 LAB — HIV ANTIBODY (ROUTINE TESTING W REFLEX): HIV 1&2 Ab, 4th Generation: NONREACTIVE

## 2023-10-17 ENCOUNTER — Other Ambulatory Visit: Payer: Self-pay

## 2023-10-17 DIAGNOSIS — Z79899 Other long term (current) drug therapy: Secondary | ICD-10-CM

## 2023-10-17 MED ORDER — EMTRICITABINE-TENOFOVIR DF 200-300 MG PO TABS: 1.0000 | ORAL_TABLET | Freq: Every day | ORAL | 0 refills | Status: DC

## 2024-01-01 ENCOUNTER — Other Ambulatory Visit: Payer: Self-pay | Admitting: Pharmacist

## 2024-01-01 DIAGNOSIS — Z79899 Other long term (current) drug therapy: Secondary | ICD-10-CM

## 2024-01-21 ENCOUNTER — Ambulatory Visit: Admitting: Pharmacist

## 2024-01-26 NOTE — Progress Notes (Signed)
 HPI: Phillip Calhoun is a 53 y.o. male who presents to the [facility] pharmacy clinic for HIV PrEP follow-up.  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   ASCORBIC ACID (VITAMIN C) 1000 MG TABLET    Take 1,000 mg by mouth daily.   COENZYME Q10 (CO Q10) 100 MG CAPS    Take 100 mg by mouth daily.   EMTRICITABINE -TENOFOVIR  (TRUVADA ) 200-300 MG TABLET    Take 1 tablet by mouth daily.   LISINOPRIL-HYDROCHLOROTHIAZIDE (PRINZIDE,ZESTORETIC) 10-12.5 MG TABLET    Take 1 tablet by mouth daily.   MULTIPLE VITAMIN (MULTIVITAMIN WITH MINERALS) TABS TABLET    Take 1 tablet by mouth daily.   PROMETHAZINE (PHENERGAN) 25 MG TABLET    Take 25 mg by mouth as needed for nausea. 1 tablet every 6 hours as needed for nausea   SAFFLOWER OIL (CLA) 1000 MG CAPS    Take 1,000 mg by mouth daily.   SUMATRIPTAN (IMITREX) 100 MG TABLET    Take 100 mg by mouth as needed.  Modified Medications   No medications on file  Discontinued Medications   No medications on file       03/04/2019   12:15 PM 12/04/2018   11:34 AM 09/03/2018   11:38 AM 06/12/2018    8:59 AM 03/11/2018   12:07 PM 03/11/2018    9:05 AM 12/04/2017    9:10 AM  [facility] HIV PREP FLOWSHEET RESULTS  Insurance Status Insured Insured Insured Insured  Insured Insured  How did you hear?       Primary care referral  Gender at birth Male Male Male Male  Male Male  Gender identity cis-Male  cis-Male  cis-Male  cis-Male   cis-Male  cis-Male   Risk for HIV  >5 partners in past 6 mos (regardless of condom use)  >5 partners in past 6 mos (regardless of condom use)   >5 partners in past 6 mos (regardless of condom use);Hx of  STI;Condomless vaginal or anal intercourse   In sexual relationship with HIV+ partner;Hx of STI   Sex Partners Men only Men only Men only  Men only  Men only  # sex partners past 3-6 mos 1-3  1-3  1-3  1-3   7-9  1-3   Sex activity preferences Insertive and receptive;Oral Insertive and receptive;Oral Insertive and receptive;Oral Insertive and receptive;Oral  Insertive and receptive;Oral Insertive and receptive;Oral  Condom use Yes Yes Yes Yes  Yes Yes  % condom use 100 100 100 99  100 100  Partners genders and ages       M 30-49;M 50+  Treated for STI? No No No No No  No  HIV symptoms? N/A  N/A  N/A  N/A   N/A    PrEP Eligibility Substantial risk for HIV  Substantial risk for HIV;HIV negative  Substantial risk for HIV  Substantial risk for HIV    HIV negative;CrCl >60 ml/min;Substantial risk for HIV   Paper work received?       No     Data saved with a previous flowsheet row definition  Labs:  SCr: Lab Results  Component Value Date   CREATININE 1.09 08/22/2021   CREATININE 1.00 11/02/2020   CREATININE 1.11 05/10/2020   CREATININE 1.15 11/17/2019   CREATININE 1.06 05/31/2019   HIV Lab Results  Component Value Date   HIV NON-REACTIVE 10/15/2023   HIV NON-REACTIVE 07/30/2023   HIV NON-REACTIVE 04/21/2023   HIV NON-REACTIVE 01/29/2023   HIV NON-REACTIVE 10/30/2022   Hepatitis B Lab Results  Component Value Date   HEPBSAB REACTIVE (A) 12/04/2017   HEPBSAG NEGATIVE 10/09/2016   Hepatitis C No results found for: HEPCAB, HCVRNAPCRQN Hepatitis A Lab Results  Component Value Date   HAV REACTIVE (A) 12/04/2017   RPR and STI Lab Results  Component Value Date   LABRPR NON-REACTIVE 07/30/2023   LABRPR NON-REACTIVE 07/31/2022   LABRPR NON-REACTIVE 02/22/2021   LABRPR NON-REACTIVE 11/02/2020   LABRPR NON-REACTIVE 05/10/2020    STI Results GC CT  07/30/2023  9:02 AM Negative    Negative    Negative  Negative    Negative    Negative   07/31/2022  9:05 AM Negative     Negative    Negative  Negative    Negative    Negative   05/24/2021  9:17 AM Negative    Negative    Negative  Negative    Negative    Negative   02/22/2021 11:36 AM Negative    Negative    Negative  Negative    Negative    Negative   11/02/2020  9:08 AM Negative    Negative    Negative  Negative    Negative    Negative   08/03/2020  9:54 AM Negative    Negative    Negative  Negative    Negative    Negative   05/10/2020  9:18 AM Negative    Negative  Negative    Negative   11/17/2019  9:25 AM Negative    Negative    Negative  Negative    Negative    Negative   05/31/2019  9:23 AM Negative    Negative    Negative  Negative    Negative    Negative   03/03/2019 12:00 AM Negative    Negative    Negative  Negative    Negative    Negative   12/03/2018 12:00 AM Negative    Negative    Negative  Negative    Negative    Negative   09/03/2018 12:00 AM Negative    Negative    Negative  Negative    Negative    Negative   06/12/2018 12:00 AM Negative    Negative    Negative  Negative    Negative    Negative   03/11/2018 12:00 AM Negative    Negative    Negative  Negative    Negative    Negative   12/04/2017 12:00 AM Negative    Negative    Negative  Negative    Negative    Negative   09/10/2017 12:00 AM Negative  Negative   06/26/2017 12:00 AM Negative    Negative    Negative  Negative    Negative    Negative   03/26/2017 12:00 AM Negative    Negative    Negative  Negative    Negative    Negative   01/14/2017 12:00 AM Negative    Negative    Negative  Negative    Negative    Negative   10/09/2016 12:00  AM Negative    Negative    Negative  Negative    Negative    Negative     Assessment: Phillip Calhoun is here today for his 3 month HIV PrEP follow up. Doing well on Descovy without problems. Screened for acute HIV symptoms such as fatigue, muscle aches, rash, sore throat, lymphadenopathy, headache, night sweats, nausea/vomiting/diarrhea, and  fever. Denies any symptoms. Last HIV antibody was negative in April. Last STI testing was negative in January. Politely declines STI testing today. PCP monitors his kidney function and cholesterol so no need to check that at our clinic. Due for Tdap vaccine and agrees to receiving today. Will see him back in 3 months.   Plan: - HIV ab today - Tdap vaccine in right deltoid - Descovy x 3 months if HIV negative - Follow up with me again on 04/28/24  Terrin Meddaugh L. Ahmiyah Coil, PharmD, BCIDP, AAHIVP, CPP Clinical Pharmacist Practitioner - Infectious Diseases Clinical Pharmacist Lead - Specialty Pharmacy [facility]

## 2024-01-27 ENCOUNTER — Other Ambulatory Visit: Payer: Self-pay

## 2024-01-27 ENCOUNTER — Ambulatory Visit: Admitting: Pharmacist

## 2024-01-27 DIAGNOSIS — Z113 Encounter for screening for infections with a predominantly sexual mode of transmission: Secondary | ICD-10-CM

## 2024-01-27 DIAGNOSIS — Z79899 Other long term (current) drug therapy: Secondary | ICD-10-CM

## 2024-01-27 DIAGNOSIS — Z23 Encounter for immunization: Secondary | ICD-10-CM | POA: Diagnosis not present

## 2024-01-28 ENCOUNTER — Other Ambulatory Visit: Payer: Self-pay | Admitting: Pharmacist

## 2024-01-28 DIAGNOSIS — Z79899 Other long term (current) drug therapy: Secondary | ICD-10-CM

## 2024-01-28 LAB — HIV ANTIBODY (ROUTINE TESTING W REFLEX): HIV 1&2 Ab, 4th Generation: NONREACTIVE

## 2024-01-28 MED ORDER — EMTRICITABINE-TENOFOVIR DF 200-300 MG PO TABS: 1.0000 | ORAL_TABLET | Freq: Every day | ORAL | 0 refills | Status: DC

## 2024-02-03 ENCOUNTER — Other Ambulatory Visit: Payer: Self-pay | Admitting: Pharmacist

## 2024-02-03 DIAGNOSIS — Z79899 Other long term (current) drug therapy: Secondary | ICD-10-CM

## 2024-04-21 ENCOUNTER — Other Ambulatory Visit: Payer: Self-pay

## 2024-04-21 ENCOUNTER — Ambulatory Visit: Admitting: Pharmacist

## 2024-04-21 DIAGNOSIS — Z79899 Other long term (current) drug therapy: Secondary | ICD-10-CM | POA: Diagnosis not present

## 2024-04-21 NOTE — Progress Notes (Signed)
 HPI: Phillip Calhoun is a 53 y.o. male who presents to the [facility] pharmacy clinic for HIV PrEP follow-up.  Referring ID Physician: Dr. 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   ASCORBIC ACID (VITAMIN C) 1000 MG TABLET    Take 1,000 mg by mouth daily.   COENZYME Q10 (CO Q10) 100 MG CAPS    Take 100 mg by mouth daily.   EMTRICITABINE -TENOFOVIR  (TRUVADA ) 200-300 MG TABLET    Take 1 tablet by mouth daily.   LISINOPRIL-HYDROCHLOROTHIAZIDE (PRINZIDE,ZESTORETIC) 10-12.5 MG TABLET    Take 1 tablet by mouth daily.   MULTIPLE VITAMIN (MULTIVITAMIN WITH MINERALS) TABS TABLET    Take 1 tablet by mouth daily.   PROMETHAZINE (PHENERGAN) 25 MG TABLET    Take 25 mg by mouth as needed for nausea. 1 tablet every 6 hours as needed for nausea   SAFFLOWER OIL (CLA) 1000 MG CAPS    Take 1,000 mg by mouth daily.   SUMATRIPTAN (IMITREX) 100 MG TABLET    Take 100 mg by mouth as needed.  Modified Medications   No medications on file  Discontinued Medications   No medications on file       03/04/2019   12:15 PM 12/04/2018   11:34 AM 09/03/2018   11:38 AM 06/12/2018    8:59 AM 03/11/2018   12:07 PM 03/11/2018    9:05 AM 12/04/2017    9:10 AM  [facility] HIV PREP FLOWSHEET RESULTS  Insurance Status Insured Insured Insured Insured  Insured Insured  How did you hear?       Primary care referral  Gender at birth Male Male Male Male  Male Male  Gender identity cis-Male  cis-Male  cis-Male  cis-Male   cis-Male  cis-Male   Risk for HIV  >5 partners in past 6 mos (regardless of condom use)  >5 partners in past 6 mos (regardless of condom use)   >5 partners in past 6 mos (regardless of condom use);Hx of  STI;Condomless vaginal or anal intercourse   In sexual relationship with HIV+ partner;Hx of STI   Sex Partners Men only Men only Men only  Men only  Men only  # sex partners past 3-6 mos 1-3  1-3  1-3  1-3   7-9  1-3   Sex activity preferences Insertive and receptive;Oral Insertive and receptive;Oral Insertive and receptive;Oral Insertive and receptive;Oral  Insertive and receptive;Oral Insertive and receptive;Oral  Condom use Yes Yes Yes Yes  Yes Yes  % condom use 100 100 100 99  100 100  Partners genders and ages       M 30-49;M 50+  Treated for STI? No No No No No  No  HIV symptoms? N/A  N/A  N/A  N/A   N/A    PrEP Eligibility Substantial risk for HIV  Substantial risk for HIV;HIV negative  Substantial risk for HIV  Substantial risk for HIV    HIV negative;CrCl >60 ml/min;Substantial risk for HIV   Paper work received?       No     Data saved with a previous flowsheet row definition    Labs:  SCr:  Lab Results  Component Value Date   CREATININE 1.09 08/22/2021   CREATININE 1.00 11/02/2020   CREATININE 1.11 05/10/2020   CREATININE 1.15 11/17/2019   CREATININE 1.06 05/31/2019   HIV Lab Results  Component Value Date   HIV NON-REACTIVE 01/27/2024   HIV NON-REACTIVE 10/15/2023   HIV NON-REACTIVE 07/30/2023   HIV NON-REACTIVE 04/21/2023   HIV NON-REACTIVE 01/29/2023   Hepatitis B Lab Results  Component Value Date   HEPBSAB REACTIVE (A) 12/04/2017   HEPBSAG NEGATIVE 10/09/2016   Hepatitis C No results found for: HEPCAB, HCVRNAPCRQN Hepatitis A Lab Results  Component Value Date   HAV REACTIVE (A) 12/04/2017   RPR and STI Lab Results  Component Value Date   LABRPR NON-REACTIVE 07/30/2023   LABRPR NON-REACTIVE 07/31/2022   LABRPR NON-REACTIVE 02/22/2021   LABRPR NON-REACTIVE 11/02/2020   LABRPR NON-REACTIVE 05/10/2020    STI Results GC CT  07/30/2023  9:02 AM Negative    Negative    Negative  Negative    Negative    Negative   07/31/2022  9:05 AM Negative     Negative    Negative  Negative    Negative    Negative   05/24/2021  9:17 AM Negative    Negative    Negative  Negative    Negative    Negative   02/22/2021 11:36 AM Negative    Negative    Negative  Negative    Negative    Negative   11/02/2020  9:08 AM Negative    Negative    Negative  Negative    Negative    Negative   08/03/2020  9:54 AM Negative    Negative    Negative  Negative    Negative    Negative   05/10/2020  9:18 AM Negative    Negative  Negative    Negative   11/17/2019  9:25 AM Negative    Negative    Negative  Negative    Negative    Negative   05/31/2019  9:23 AM Negative    Negative    Negative  Negative    Negative    Negative   03/03/2019 12:00 AM Negative    Negative    Negative  Negative    Negative    Negative   12/03/2018 12:00 AM Negative    Negative    Negative  Negative    Negative    Negative   09/03/2018 12:00 AM Negative    Negative    Negative  Negative    Negative    Negative   06/12/2018 12:00 AM Negative    Negative    Negative  Negative    Negative    Negative   03/11/2018 12:00 AM Negative    Negative    Negative  Negative    Negative    Negative   12/04/2017 12:00 AM Negative    Negative    Negative  Negative    Negative    Negative   09/10/2017 12:00 AM Negative  Negative   06/26/2017 12:00 AM Negative    Negative    Negative  Negative    Negative    Negative   03/26/2017 12:00 AM Negative    Negative    Negative  Negative    Negative    Negative   01/14/2017 12:00 AM Negative    Negative    Negative  Negative    Negative    Negative   10/09/2016 12:00 AM Negative  Negative    Negative  Negative    Negative    Negative     Assessment: Phillip Calhoun is here today to follow up for HIV prevention. Doing well on Truvada  without problems. Screened for acute HIV symptoms such as fatigue, muscle aches, rash, sore throat, lymphadenopathy, headache, night sweats, nausea/vomiting/diarrhea, and fever.  Denies any symptoms. Declines STI testing today. Wishes to schedule an appointment in a few weeks to receive flu and covid vaccines. Also will see his PCP soon and get annual SCr and lipid panel labs. No issues today. Will see him back in 3 months.   Plan: - HIV ab today - Truvada  x 3 months if HIV negative - Follow up on 05/11/24 for vaccines - Follow up again for HIV PrEP on 07/14/24  Phillip Calhoun L. Laverle Pillard, PharmD, BCIDP, AAHIVP, CPP Clinical Pharmacist Practitioner - Infectious Diseases Clinical Pharmacist Lead - Specialty Pharmacy [facility]

## 2024-04-22 LAB — HIV ANTIBODY (ROUTINE TESTING W REFLEX)
HIV 1&2 Ab, 4th Generation: NONREACTIVE
HIV FINAL INTERPRETATION: NEGATIVE

## 2024-04-23 ENCOUNTER — Other Ambulatory Visit: Payer: Self-pay | Admitting: Pharmacist

## 2024-04-23 DIAGNOSIS — Z79899 Other long term (current) drug therapy: Secondary | ICD-10-CM

## 2024-04-23 MED ORDER — EMTRICITABINE-TENOFOVIR DF 200-300 MG PO TABS: 1.0000 | ORAL_TABLET | Freq: Every day | ORAL | 0 refills | Status: DC

## 2024-04-28 ENCOUNTER — Ambulatory Visit: Admitting: Pharmacist

## 2024-05-11 ENCOUNTER — Other Ambulatory Visit: Payer: Self-pay

## 2024-05-11 ENCOUNTER — Ambulatory Visit (INDEPENDENT_AMBULATORY_CARE_PROVIDER_SITE_OTHER): Admitting: Pharmacist

## 2024-05-11 DIAGNOSIS — Z23 Encounter for immunization: Secondary | ICD-10-CM | POA: Diagnosis not present

## 2024-05-11 NOTE — Progress Notes (Signed)
   [facility] Visit  HPI: Phillip Calhoun is a 53 y.o. male who presents to the [facility] pharmacy clinic for vaccine administration.  Referring ID Physician: Cathlyn July, NP  Hepatitis B Lab Results  Component Value Date   HEPBSAB REACTIVE (A) 12/04/2017   Lab Results  Component Value Date   HEPBSAG NEGATIVE 10/09/2016    Hepatitis C Lab Results  Component Value Date   HCVAB NEGATIVE 10/09/2016    Hepatitis A Lab Results  Component Value Date   HAV REACTIVE (A) 12/04/2017    Assessment & Plan: - Administered annual flu and COVID vaccines - Patient tolerated well - Follow up with Cassie in January for PrEP management   Alan Geralds, PharmD, CPP, BCIDP, AAHIVP Clinical Pharmacist Practitioner Infectious Diseases Clinical Pharmacist [facility] 05/11/2024, 10:22 AM

## 2024-07-05 ENCOUNTER — Other Ambulatory Visit: Payer: Self-pay | Admitting: Pharmacist

## 2024-07-05 DIAGNOSIS — Z79899 Other long term (current) drug therapy: Secondary | ICD-10-CM

## 2024-07-06 NOTE — Progress Notes (Unsigned)
 "  HPI: Phillip Calhoun is a 53 y.o. male who presents to the [facility] pharmacy clinic for HIV PrEP follow-up.  Referring ID Physician: Dr. 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   ASCORBIC ACID (VITAMIN C) 1000 MG TABLET    Take 1,000 mg by mouth daily.   COENZYME Q10 (CO Q10) 100 MG CAPS    Take 100 mg by mouth daily.   EMTRICITABINE -TENOFOVIR  (TRUVADA ) 200-300 MG TABLET    Take 1 tablet by mouth daily.   LISINOPRIL-HYDROCHLOROTHIAZIDE (PRINZIDE,ZESTORETIC) 10-12.5 MG TABLET    Take 1 tablet by mouth daily.   MULTIPLE VITAMIN (MULTIVITAMIN WITH MINERALS) TABS TABLET    Take 1 tablet by mouth daily.   PROMETHAZINE (PHENERGAN) 25 MG TABLET    Take 25 mg by mouth as needed for nausea. 1 tablet every 6 hours as needed for nausea   SAFFLOWER OIL (CLA) 1000 MG CAPS    Take 1,000 mg by mouth daily.   SUMATRIPTAN (IMITREX) 100 MG TABLET    Take 100 mg by mouth as needed.  Modified Medications   No medications on file  Discontinued Medications   No medications on file       03/04/2019   12:15 PM 12/04/2018   11:34 AM 09/03/2018   11:38 AM 06/12/2018    8:59 AM 03/11/2018   12:07 PM 03/11/2018    9:05 AM 12/04/2017    9:10 AM  [facility] HIV PREP FLOWSHEET RESULTS  Insurance Status Insured Insured Insured Insured  Insured Insured  How did you hear?       Primary care referral  Gender at birth Male Male Male Male  Male Male  Gender identity cis-Male  cis-Male  cis-Male  cis-Male   cis-Male  cis-Male   Risk for HIV  >5 partners in past 6 mos (regardless of condom use)  >5 partners in past 6 mos (regardless of condom use)   >5 partners in past 6 mos (regardless of condom use);Hx of  STI;Condomless vaginal or anal intercourse   In sexual relationship with HIV+ partner;Hx of STI   Sex Partners Men only Men only Men only  Men only  Men only  # sex partners past 3-6 mos 1-3  1-3  1-3  1-3   7-9  1-3   Sex activity preferences Insertive and receptive;Oral Insertive and receptive;Oral Insertive and receptive;Oral Insertive and receptive;Oral  Insertive and receptive;Oral Insertive and receptive;Oral  Condom use Yes Yes Yes Yes  Yes Yes  % condom use 100 100 100 99  100 100  Partners genders and ages       M 30-49;M 50+  Treated for STI? No No No No No  No  HIV symptoms? N/A  N/A  N/A  N/A   N/A    PrEP Eligibility Substantial risk for HIV  Substantial risk for HIV;HIV negative  Substantial risk for HIV  Substantial risk for HIV    HIV negative;CrCl >60 ml/min;Substantial risk for HIV   Paper work received?       No     Data saved with a previous flowsheet row definition    Labs:  SCr:  Lab Results  Component Value Date   CREATININE 1.09 08/22/2021   CREATININE 1.00 11/02/2020   CREATININE 1.11 05/10/2020   CREATININE 1.15 11/17/2019   CREATININE 1.06 05/31/2019   HIV Lab Results  Component Value Date   HIV NON-REACTIVE 04/21/2024   HIV NON-REACTIVE 01/27/2024   HIV NON-REACTIVE 10/15/2023   HIV NON-REACTIVE 07/30/2023   HIV NON-REACTIVE 04/21/2023   Hepatitis B Lab Results  Component Value Date   HEPBSAB REACTIVE (A) 12/04/2017   HEPBSAG NEGATIVE 10/09/2016   Hepatitis C No results found for: HEPCAB, HCVRNAPCRQN Hepatitis A Lab Results  Component Value Date   HAV REACTIVE (A) 12/04/2017   RPR and STI Lab Results  Component Value Date   LABRPR NON-REACTIVE 07/30/2023   LABRPR NON-REACTIVE 07/31/2022   LABRPR NON-REACTIVE 02/22/2021   LABRPR NON-REACTIVE 11/02/2020   LABRPR NON-REACTIVE 05/10/2020    STI Results GC CT  07/30/2023  9:02 AM Negative    Negative    Negative  Negative    Negative    Negative   07/31/2022  9:05 AM Negative     Negative    Negative  Negative    Negative    Negative   05/24/2021  9:17 AM Negative    Negative    Negative  Negative    Negative    Negative   02/22/2021 11:36 AM Negative    Negative    Negative  Negative    Negative    Negative   11/02/2020  9:08 AM Negative    Negative    Negative  Negative    Negative    Negative   08/03/2020  9:54 AM Negative    Negative    Negative  Negative    Negative    Negative   05/10/2020  9:18 AM Negative    Negative  Negative    Negative   11/17/2019  9:25 AM Negative    Negative    Negative  Negative    Negative    Negative   05/31/2019  9:23 AM Negative    Negative    Negative  Negative    Negative    Negative   03/03/2019 12:00 AM Negative    Negative    Negative  Negative    Negative    Negative   12/03/2018 12:00 AM Negative    Negative    Negative  Negative    Negative    Negative   09/03/2018 12:00 AM Negative    Negative    Negative  Negative    Negative    Negative   06/12/2018 12:00 AM Negative    Negative    Negative  Negative    Negative    Negative   03/11/2018 12:00 AM Negative    Negative    Negative  Negative    Negative    Negative   12/04/2017 12:00 AM Negative    Negative    Negative  Negative    Negative    Negative   09/10/2017 12:00 AM Negative  Negative   06/26/2017 12:00 AM Negative    Negative    Negative  Negative    Negative    Negative   03/26/2017 12:00 AM Negative    Negative    Negative  Negative    Negative    Negative   01/14/2017 12:00 AM Negative    Negative    Negative  Negative    Negative    Negative   10/09/2016 12:00 AM Negative  Negative    Negative  Negative    Negative    Negative     Assessment: Phillip Calhoun is here today for his 3 month PrEP follow up appointment. Doing well on Truvada  without issues. No problems with [facility] and no changes in his insurance for 2026. Screened for acute HIV symptoms such as fatigue, muscle aches, rash, sore  throat, lymphadenopathy, headache, night sweats, nausea/vomiting/diarrhea, and fever. Denies any symptoms.    Labs: Last HIV ab was negative on 04/21/24; gets BMP and cholesterol checked through PCP; requesting full STI testing today. No symptoms or issues noted.  Eligible vaccinations: Currently up to date on all recommended vaccines.    Plan: - HIV ab, RPR, and urine/rectal/pharyngeal cytologies for GC/chlamydia  - Truvada  x 3 months if HIV negative  - Follow up with me again on 09/29/24  Sweta Halseth L. Loray Akard, PharmD, BCIDP, AAHIVP, CPP Clinical Pharmacist Practitioner - Infectious Diseases Clinical Pharmacist Lead - Specialty Pharmacy [facility]  "

## 2024-07-07 ENCOUNTER — Ambulatory Visit (INDEPENDENT_AMBULATORY_CARE_PROVIDER_SITE_OTHER): Admitting: Pharmacist

## 2024-07-07 ENCOUNTER — Other Ambulatory Visit: Payer: Self-pay

## 2024-07-07 DIAGNOSIS — Z113 Encounter for screening for infections with a predominantly sexual mode of transmission: Secondary | ICD-10-CM

## 2024-07-07 DIAGNOSIS — Z79899 Other long term (current) drug therapy: Secondary | ICD-10-CM

## 2024-07-09 ENCOUNTER — Other Ambulatory Visit: Payer: Self-pay | Admitting: Pharmacist

## 2024-07-09 DIAGNOSIS — Z79899 Other long term (current) drug therapy: Secondary | ICD-10-CM

## 2024-07-09 LAB — CT/NG RNA, TMA RECTAL
Chlamydia Trachomatis RNA: NOT DETECTED
Neisseria Gonorrhoeae RNA: NOT DETECTED

## 2024-07-09 LAB — GC/CHLAMYDIA PROBE, AMP (THROAT)
Chlamydia trachomatis RNA: NOT DETECTED
Neisseria gonorrhoeae RNA: NOT DETECTED

## 2024-07-09 LAB — C. TRACHOMATIS/N. GONORRHOEAE RNA
C. trachomatis RNA, TMA: NOT DETECTED
N. gonorrhoeae RNA, TMA: NOT DETECTED

## 2024-07-09 MED ORDER — EMTRICITABINE-TENOFOVIR DF 200-300 MG PO TABS: 1.0000 | ORAL_TABLET | Freq: Every day | ORAL | 0 refills | Status: AC

## 2024-07-10 LAB — HIV ANTIBODY (ROUTINE TESTING W REFLEX)
HIV 1&2 Ab, 4th Generation: NONREACTIVE
HIV FINAL INTERPRETATION: NEGATIVE

## 2024-07-10 LAB — SYPHILIS: RPR W/REFLEX TO RPR TITER AND TREPONEMAL ANTIBODIES, TRADITIONAL SCREENING AND DIAGNOSIS ALGORITHM: RPR Ser Ql: NONREACTIVE

## 2024-07-14 ENCOUNTER — Ambulatory Visit: Payer: Self-pay | Admitting: Pharmacist

## 2024-09-29 ENCOUNTER — Ambulatory Visit: Payer: Self-pay | Admitting: Pharmacist
